# Patient Record
Sex: Male | Born: 1970 | Race: White | Hispanic: No | Marital: Married | Smoking: Never smoker
Health system: Southern US, Community
[De-identification: ages and names within clinical notes are randomized; demographics above are authoritative.]

## PROBLEM LIST (undated history)

## (undated) HISTORY — PX: NO PAST SURGERIES: SHX2092

---

## 2007-09-09 ENCOUNTER — Other Ambulatory Visit: Payer: Self-pay

## 2007-09-09 ENCOUNTER — Emergency Department: Payer: Self-pay | Admitting: Emergency Medicine

## 2007-09-11 ENCOUNTER — Ambulatory Visit: Payer: Self-pay | Admitting: Emergency Medicine

## 2013-08-03 ENCOUNTER — Emergency Department: Payer: Self-pay | Admitting: Emergency Medicine

## 2013-08-03 LAB — CBC WITH DIFFERENTIAL/PLATELET
BASOS ABS: 0 10*3/uL (ref 0.0–0.1)
Basophil %: 0.7 %
Eosinophil #: 0.3 10*3/uL (ref 0.0–0.7)
Eosinophil %: 4 %
HCT: 45.1 % (ref 40.0–52.0)
HGB: 15.6 g/dL (ref 13.0–18.0)
LYMPHS ABS: 0.9 10*3/uL — AB (ref 1.0–3.6)
LYMPHS PCT: 14.5 %
MCH: 30.9 pg (ref 26.0–34.0)
MCHC: 34.7 g/dL (ref 32.0–36.0)
MCV: 89 fL (ref 80–100)
Monocyte #: 0.3 x10 3/mm (ref 0.2–1.0)
Monocyte %: 5.1 %
Neutrophil #: 5 10*3/uL (ref 1.4–6.5)
Neutrophil %: 75.7 %
Platelet: 181 10*3/uL (ref 150–440)
RBC: 5.07 10*6/uL (ref 4.40–5.90)
RDW: 12.8 % (ref 11.5–14.5)
WBC: 6.5 10*3/uL (ref 3.8–10.6)

## 2013-08-03 LAB — COMPREHENSIVE METABOLIC PANEL
ANION GAP: 6 — AB (ref 7–16)
AST: 18 U/L (ref 15–37)
Albumin: 4 g/dL (ref 3.4–5.0)
Alkaline Phosphatase: 79 U/L
BUN: 10 mg/dL (ref 7–18)
Bilirubin,Total: 1.2 mg/dL — ABNORMAL HIGH (ref 0.2–1.0)
Calcium, Total: 8.8 mg/dL (ref 8.5–10.1)
Chloride: 104 mmol/L (ref 98–107)
Co2: 30 mmol/L (ref 21–32)
Creatinine: 1.07 mg/dL (ref 0.60–1.30)
EGFR (African American): >60
EGFR (Non-African Amer.): >60
Glucose: 108 mg/dL — ABNORMAL HIGH (ref 65–99)
Osmolality: 279 (ref 275–301)
Potassium: 3.8 mmol/L (ref 3.5–5.1)
SGPT (ALT): 36 U/L (ref 12–78)
SODIUM: 140 mmol/L (ref 136–145)
Total Protein: 7.3 g/dL (ref 6.4–8.2)

## 2013-08-03 LAB — TROPONIN I: Troponin-I: <0.02 ng/mL

## 2013-08-03 IMAGING — CR DG CHEST 1V PORT
1 series · 2 of 2 positions shown · non-contrast
Comparison: None.

CLINICAL DATA: Chest pain

EXAM:
PORTABLE CHEST - 1 VIEW

[Series 1: ap · 0.17mm/px · 2 of 2 slices shown]
[im 1/2]
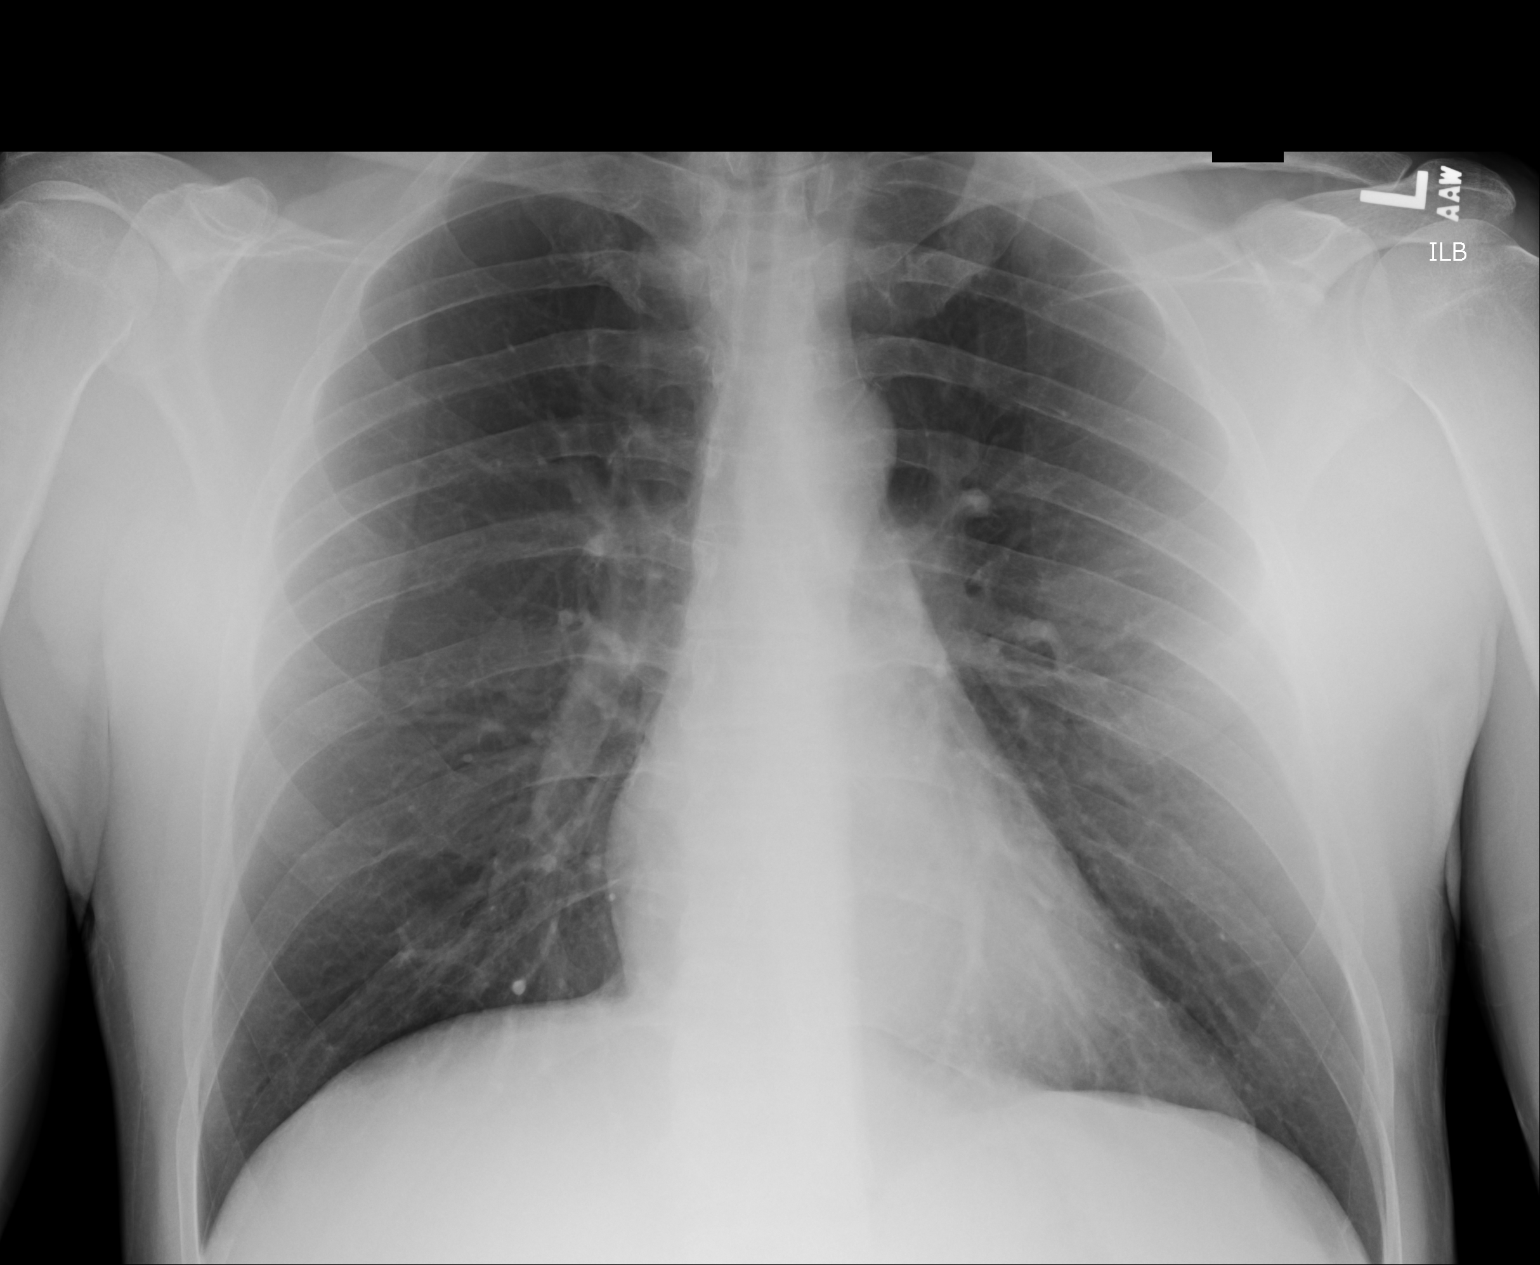
[im 2/2]
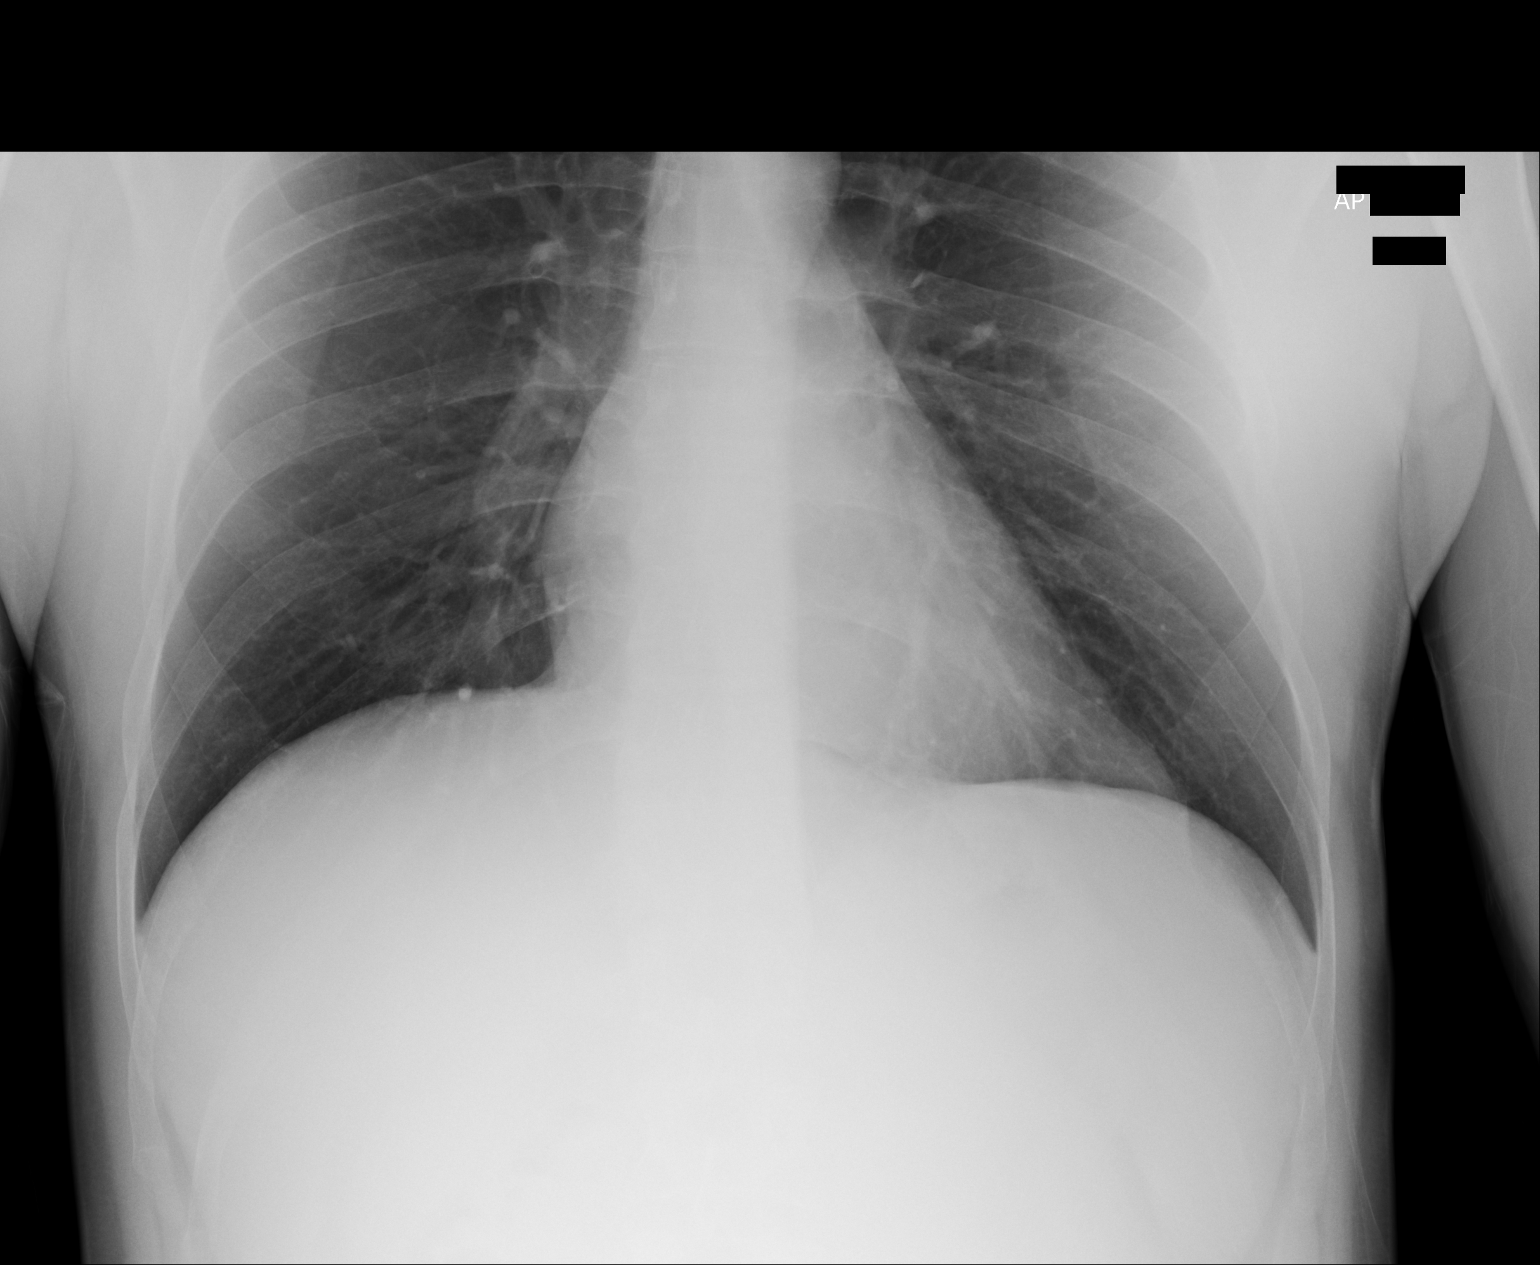

[2 of 2 positions shown; findings below may reference images not displayed]

FINDINGS: The heart size and mediastinal contours are within normal limits.
Both lungs are clear. The visualized skeletal structures are
unremarkable.
IMPRESSION: No active disease.

## 2018-11-19 ENCOUNTER — Other Ambulatory Visit: Payer: Self-pay

## 2018-11-19 ENCOUNTER — Emergency Department: Payer: Managed Care, Other (non HMO)

## 2018-11-19 ENCOUNTER — Encounter: Payer: Self-pay | Admitting: Emergency Medicine

## 2018-11-19 ENCOUNTER — Emergency Department
Admission: EM | Admit: 2018-11-19 | Discharge: 2018-11-20 | Disposition: A | Discharge status: Home / Self Care | Payer: Managed Care, Other (non HMO) | Attending: Emergency Medicine | Admitting: Emergency Medicine

## 2018-11-19 DIAGNOSIS — R0789 Other chest pain: Secondary | ICD-10-CM | POA: Insufficient documentation

## 2018-11-19 DIAGNOSIS — M79602 Pain in left arm: Secondary | ICD-10-CM | POA: Diagnosis not present

## 2018-11-19 DIAGNOSIS — R079 Chest pain, unspecified: Secondary | ICD-10-CM

## 2018-11-19 DIAGNOSIS — R6884 Jaw pain: Secondary | ICD-10-CM | POA: Diagnosis not present

## 2018-11-19 LAB — CBC WITH DIFFERENTIAL/PLATELET
Abs Immature Granulocytes: 0.02 10*3/uL (ref 0.00–0.07)
Basophils Absolute: 0.1 10*3/uL (ref 0.0–0.1)
Basophils Relative: 1 %
Eosinophils Absolute: 0.4 10*3/uL (ref 0.0–0.5)
Eosinophils Relative: 6 %
HCT: 44.5 % (ref 39.0–52.0)
Hemoglobin: 15.8 g/dL (ref 13.0–17.0)
Immature Granulocytes: 0 %
Lymphocytes Relative: 25 %
Lymphs Abs: 1.7 10*3/uL (ref 0.7–4.0)
MCH: 30.7 pg (ref 26.0–34.0)
MCHC: 35.5 g/dL (ref 30.0–36.0)
MCV: 86.4 fL (ref 80.0–100.0)
Monocytes Absolute: 0.5 10*3/uL (ref 0.1–1.0)
Monocytes Relative: 8 %
Neutro Abs: 4.1 10*3/uL (ref 1.7–7.7)
Neutrophils Relative %: 60 %
Platelets: 202 10*3/uL (ref 150–400)
RBC: 5.15 MIL/uL (ref 4.22–5.81)
RDW: 12.1 % (ref 11.5–15.5)
WBC: 6.9 10*3/uL (ref 4.0–10.5)
nRBC: 0 % (ref 0.0–0.2)

## 2018-11-19 LAB — COMPREHENSIVE METABOLIC PANEL
ALT: 39 U/L (ref 0–44)
AST: 25 U/L (ref 15–41)
Albumin: 4.4 g/dL (ref 3.5–5.0)
Alkaline Phosphatase: 65 U/L (ref 38–126)
Anion gap: 4 — ABNORMAL LOW (ref 5–15)
BUN: 10 mg/dL (ref 6–20)
CO2: 30 mmol/L (ref 22–32)
Calcium: 8.7 mg/dL — ABNORMAL LOW (ref 8.9–10.3)
Chloride: 105 mmol/L (ref 98–111)
Creatinine, Ser: 1.02 mg/dL (ref 0.61–1.24)
GFR calc Af Amer: >60 mL/min (ref >60)
GFR calc non Af Amer: >60 mL/min (ref >60)
Glucose, Bld: 106 mg/dL — ABNORMAL HIGH (ref 70–99)
Potassium: 3.8 mmol/L (ref 3.5–5.1)
Sodium: 139 mmol/L (ref 135–145)
Total Bilirubin: 1.4 mg/dL — ABNORMAL HIGH (ref 0.3–1.2)
Total Protein: 7.3 g/dL (ref 6.5–8.1)

## 2018-11-19 LAB — TROPONIN I (HIGH SENSITIVITY): Troponin I (High Sensitivity): 4 ng/L (ref <18)

## 2018-11-19 NOTE — ED Notes (Signed)
Dr Brown and Butch, RN at bedside at this time. 

## 2018-11-19 NOTE — ED Triage Notes (Signed)
Patient ambulatory to triage with steady gait, without difficulty or distress noted, mask in place; pt reports left sided CP radiating into jaw with tingling to left arm since approx 830pm; st hx of same years ago with negative findings; st pain accomp by dizziness; 3-81mg  ASA taken PTA

## 2018-11-20 ENCOUNTER — Encounter: Payer: Self-pay | Admitting: Radiology

## 2018-11-20 ENCOUNTER — Emergency Department: Payer: Managed Care, Other (non HMO)

## 2018-11-20 LAB — TROPONIN I (HIGH SENSITIVITY): Troponin I (High Sensitivity): 4 ng/L (ref <18)

## 2018-11-20 MED ORDER — IOHEXOL 350 MG/ML SOLN
75.0000 mL | Freq: Once | INTRAVENOUS | Status: AC | PRN
  Administered 2018-11-20: 75 mL via INTRAVENOUS

## 2018-11-20 MED ORDER — ALUM & MAG HYDROXIDE-SIMETH 200-200-20 MG/5ML PO SUSP
30.0000 mL | Freq: Once | ORAL | Status: AC
  Administered 2018-11-20: 02:00:00 30 mL via ORAL
  Filled 2018-11-20: qty 30

## 2018-11-20 MED ORDER — LIDOCAINE VISCOUS HCL 2 % MT SOLN
15.0000 mL | Freq: Once | OROMUCOSAL | Status: AC
  Administered 2018-11-20: 15 mL via ORAL
  Filled 2018-11-20: qty 15

## 2018-11-20 NOTE — ED Provider Notes (Signed)
[facility] Emergency Department Provider Note    First MD Initiated Contact with Patient 11/19/18 2356     (approximate)  I have reviewed the triage vital signs and the nursing notes.   HISTORY  Chief Complaint Chest Pain    HPI Walter Baldwin is a 48 y.o. male with no chronic medical conditions presents to the emergency department secondary to left-sided chest pain with radiation to the left arm and jaw which patient states began at 8:30 PM.  Patient states that he had a similar episode 5 years without any etiology identified.  Patient states current pain score is 1 out of 10.  Patient also admits to difficulty swallowing pills feeling as though they get stuck.  Patient states that the pain felt like indigestion.        History reviewed. No pertinent past medical history.  There are no active problems to display for this patient.   History reviewed. No pertinent surgical history.  Prior to Admission medications   Not on File    Allergies Patient has no known allergies.  No family history on file.  Social History Social History   Tobacco Use   Smoking status: Never Smoker   Smokeless tobacco: Never Used  Substance Use Topics   Alcohol use: Not on file   Drug use: Not on file    Review of Systems Constitutional: No fever/chills Eyes: No visual changes. ENT: No sore throat. Cardiovascular: Positive for chest pain. Respiratory: Denies shortness of breath. Gastrointestinal: No abdominal pain.  No nausea, no vomiting.  No diarrhea.  No constipation. Genitourinary: Negative for dysuria. Musculoskeletal: Negative for neck pain.  Negative for back pain. Integumentary: Negative for rash. Neurological: Negative for headaches, focal weakness or numbness.   ____________________________________________   PHYSICAL EXAM:  VITAL SIGNS: ED Triage Vitals  Enc Vitals Group     BP 11/19/18 2208 (!) 165/100     Pulse Rate 11/19/18 2208  69     Resp 11/19/18 2208 18     Temp 11/19/18 2208 98 F (36.7 C)     Temp Source 11/19/18 2208 Oral     SpO2 11/19/18 2208 100 %     Weight 11/19/18 2209 79.4 kg (175 lb)     Height 11/19/18 2209 1.778 m (5\' 10" )     Head Circumference --      Peak Flow --      Pain Score 11/19/18 2208 3     Pain Loc --      Pain Edu? --      Excl. in GC? --     Constitutional: Alert and oriented.  Eyes: Conjunctivae are normal.  Mouth/Throat: Mucous membranes are moist. Neck: No stridor.  No meningeal signs.   Cardiovascular: Normal rate, regular rhythm. Good peripheral circulation. Grossly normal heart sounds. Respiratory: Normal respiratory effort.  No retractions. Gastrointestinal: Soft and nontender. No distention.  Musculoskeletal: No lower extremity tenderness nor edema. No gross deformities of extremities. Neurologic:  Normal speech and language. No gross focal neurologic deficits are appreciated.  Skin:  Skin is warm, dry and intact. Psychiatric: Mood and affect are normal. Speech and behavior are normal.  ____________________________________________   LABS (all labs ordered are listed, but only abnormal results are displayed)  Labs Reviewed  COMPREHENSIVE METABOLIC PANEL - Abnormal; Notable for the following components:      Result Value   Glucose, Bld 106 (*)    Calcium 8.7 (*)    Total Bilirubin 1.4 (*)  Anion gap 4 (*)    All other components within normal limits  CBC WITH DIFFERENTIAL/PLATELET  TROPONIN I (HIGH SENSITIVITY)  TROPONIN I (HIGH SENSITIVITY)   ____________________________________________  EKG  ED ECG REPORT I, [place] Marlaya Turck, the attending physician, personally viewed and interpreted this ECG.   Date: 11/19/2018  EKG Time: 10:13 PM  Rate: 58  Rhythm: Sinus bradycardia  Axis: Normal  Intervals: Normal  ST&T Change: None  ____________________________________________  RADIOLOGY I, [place] Kamia Insalaco, personally viewed and evaluated these  images (plain radiographs) as part of my medical decision making, as well as reviewing the written report by the radiologist.  ED MD interpretation: Chest x-ray revealed normal exam per the radiologist.  CT angiogram of the chest revealed no acute abnormality.  Official radiology report(s): Dg Chest 2 View  Result Date: 11/19/2018 CLINICAL DATA:  Chest pain. EXAM: CHEST - 2 VIEW COMPARISON:  08/03/2013 FINDINGS: The heart size and mediastinal contours are within normal limits. Both lungs are clear. The visualized skeletal structures are unremarkable. IMPRESSION: Normal exam. Electronically Signed   By: Lorriane Shire M.D.   On: 11/19/2018 22:30   Ct Angio Chest Pe W And/or Wo Contrast  Result Date: 11/20/2018 CLINICAL DATA:  Chest pain radiating to jaw EXAM: CT ANGIOGRAPHY CHEST WITH CONTRAST TECHNIQUE: Multidetector CT imaging of the chest was performed using the standard protocol during bolus administration of intravenous contrast. Multiplanar CT image reconstructions and MIPs were obtained to evaluate the vascular anatomy. CONTRAST:  59mL OMNIPAQUE IOHEXOL 350 MG/ML SOLN COMPARISON:  Same-day radiograph FINDINGS: Cardiovascular: Preferential opacification of the pulmonary arteries. There is satisfactory opacification of the pulmonary arteries to the segmental level. No central, lobar or segmental filling defects are identified. No central pulmonary arterial enlargement or elevation of the RV/LV ratio (0.8). The aorta is normal caliber. No dissection flap or other acute luminal abnormality of the aorta is seen. No periaortic stranding or hemorrhage. Shared origin of the brachiocephalic and left common carotid artery. Normal heart size. No pericardial effusion. Mediastinum/Nodes: No enlarged mediastinal or axillary lymph nodes. Thyroid gland, trachea, and esophagus demonstrate no significant findings. Lungs/Pleura: No consolidation, features of edema, pneumothorax, or effusion. Dependent atelectasis  posteriorly. No suspicious pulmonary nodules or masses. Upper Abdomen: No acute abnormalities present in the visualized portions of the upper abdomen. Musculoskeletal: No chest wall abnormality. No acute or significant osseous findings. Review of the MIP images confirms the above findings. IMPRESSION: Satisfactory opacification of the pulmonary arteries without evidence of pulmonary embolism. No visible acute aortic abnormality on this nontailored examination. No acute intrathoracic process. Electronically Signed   By: Lovena Le M.D.   On: 11/20/2018 00:47    Procedures   ____________________________________________   INITIAL IMPRESSION / MDM / ASSESSMENT AND PLAN / ED COURSE  As part of my medical decision making, I reviewed the following data within the electronic MEDICAL RECORD NUMBER   48 year old male presenting with above-stated history and physical exam secondary to chest pain.  Considered possibly CAD MI however EKG revealed no evidence of ischemia or infarction laboratory data including high-sensitivity troponin negative x2.  CT angiogram of chest was performed to evaluate for possible pulmonary emboli aortic aneurysm.  CT revealed no acute abnormality as well.  We will refer patient to cardiology Dr. Ubaldo Glassing  ____________________________________________  FINAL CLINICAL IMPRESSION(S) / ED DIAGNOSES  Final diagnoses:  Nonspecific chest pain     MEDICATIONS GIVEN DURING THIS VISIT:  Medications  alum & mag hydroxide-simeth (MAALOX/MYLANTA) 200-200-20 MG/5ML suspension 30 mL (  has no administration in time range)    And  lidocaine (XYLOCAINE) 2 % viscous mouth solution 15 mL (has no administration in time range)  iohexol (OMNIPAQUE) 350 MG/ML injection 75 mL (75 mLs Intravenous Contrast Given 11/20/18 0028)     ED Discharge Orders    None      *Please note:  ISHAWN MULROY was evaluated in Emergency Department on 11/20/2018 for the symptoms described in the history of present  illness. He was evaluated in the context of the global COVID-19 pandemic, which necessitated consideration that the patient might be at risk for infection with the SARS-CoV-2 virus that causes COVID-19. Institutional protocols and algorithms that pertain to the evaluation of patients at risk for COVID-19 are in a state of rapid change based on information released by regulatory bodies including the CDC and federal and state organizations. These policies and algorithms were followed during the patient's care in the ED.  Some ED evaluations and interventions may be delayed as a result of limited staffing during the pandemic.*  Note:  This document was prepared using Dragon voice recognition software and may include unintentional dictation errors.   Darci Current, MD 11/20/18 [phone]

## 2018-11-20 NOTE — ED Notes (Signed)
Patient transported to CT at this time. 

## 2018-12-03 DIAGNOSIS — J982 Interstitial emphysema: Secondary | ICD-10-CM

## 2018-12-03 HISTORY — DX: Interstitial emphysema: J98.2

## 2018-12-04 ENCOUNTER — Ambulatory Visit (INDEPENDENT_AMBULATORY_CARE_PROVIDER_SITE_OTHER): Payer: Managed Care, Other (non HMO) | Admitting: Physician Assistant

## 2018-12-04 ENCOUNTER — Encounter: Payer: Self-pay | Admitting: Physician Assistant

## 2018-12-04 ENCOUNTER — Other Ambulatory Visit: Payer: Self-pay | Admitting: Physician Assistant

## 2018-12-04 ENCOUNTER — Other Ambulatory Visit: Payer: Self-pay

## 2018-12-04 VITALS — BP 136/90 | HR 65 | Temp 97.1°F | Ht 69.0 in | Wt 167.0 lb

## 2018-12-04 DIAGNOSIS — Z23 Encounter for immunization: Secondary | ICD-10-CM

## 2018-12-04 DIAGNOSIS — Z0000 Encounter for general adult medical examination without abnormal findings: Secondary | ICD-10-CM | POA: Diagnosis not present

## 2018-12-04 DIAGNOSIS — Z1211 Encounter for screening for malignant neoplasm of colon: Secondary | ICD-10-CM

## 2018-12-04 DIAGNOSIS — Z114 Encounter for screening for human immunodeficiency virus [HIV]: Secondary | ICD-10-CM

## 2018-12-04 NOTE — Patient Instructions (Signed)

## 2018-12-04 NOTE — Progress Notes (Signed)
Patient: Walter Baldwin Male    DOB: 05/15/1970   48 y.o.   MRN: [id number] Visit Date: 12/04/2018  Today's Provider: Trey Sailors, PA-C   Chief Complaint  Patient presents with  . Establish Care   Subjective:     HPI  Living with wife of 30 years, two children a son aged 66 and daughter aged 35. 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 Alcohol: none. Drugs: never.   Mom died of heart at age 50. Dad died of lung cancer at age 101. No family history of colon or prostate cancer.   Seen in [facility] for chest pain on 11/19/2018 with negative workup. Reports he underwent stress test in 2015 with Dr. Welton Flakes which was negative.   Did eat today: fruit loops at 3:30 AM and peanut butter crackers   Upcoming appointment with [facility] for difficulty swallowing.   BP slightly elevated today but patient reports 120s/80s at home.   BP Readings from Last 3 Encounters:  12/04/18 136/90  11/20/18 132/89    No Known Allergies  No current outpatient medications on file.  Review of Systems  Constitutional: Negative.   HENT: Positive for trouble swallowing. Negative for congestion, dental problem, drooling, ear discharge, ear pain, facial swelling, hearing loss, mouth sores, nosebleeds, postnasal drip, rhinorrhea, sinus pressure, sinus pain, sneezing, sore throat, tinnitus and voice change.   Eyes: Negative.   Respiratory: Negative.   Cardiovascular: Negative.   Gastrointestinal: Negative.   Endocrine: Negative.   Genitourinary: Negative.   Musculoskeletal: Negative.   Skin: Negative.   Allergic/Immunologic: Negative.   Neurological: Negative.   Hematological: Negative.   Psychiatric/Behavioral: Negative.     Social History   Tobacco Use  . Smoking status: Never Smoker  . Smokeless tobacco: Never Used  Substance Use Topics  . Alcohol use: Never    Frequency: Never      Objective:   BP 136/90 (BP Location: Left Arm, Patient Position: Sitting, Cuff Size:  Normal)   Pulse 65   Temp (!) 97.1 F (36.2 C) (Temporal)   Ht 5\' 9"  (1.753 m)   Wt 167 lb (75.8 kg)   BMI 24.66 kg/m  Vitals:   12/04/18 1003  BP: 136/90  Pulse: 65  Temp: (!) 97.1 F (36.2 C)  TempSrc: Temporal  Weight: 167 lb (75.8 kg)  Height: 5\' 9"  (1.753 m)  Body mass index is 24.66 kg/m.   Physical Exam Constitutional:      Appearance: Normal appearance. He is normal weight.  Cardiovascular:     Rate and Rhythm: Normal rate and regular rhythm.     Heart sounds: Normal heart sounds.  Pulmonary:     Effort: Pulmonary effort is normal.     Breath sounds: Normal breath sounds.  Abdominal:     General: Abdomen is flat.     Palpations: Abdomen is soft.  Skin:    General: Skin is warm and dry.  Neurological:     Mental Status: He is alert and oriented to person, place, and time. Mental status is at baseline.  Psychiatric:        Mood and Affect: Mood normal.        Behavior: Behavior normal.      No results found for any visits on 12/04/18.     Assessment & Plan    1. Annual physical exam  - Comprehensive Metabolic Panel (CMET) - CBC with Differential - TSH - Lipid Profile - HgB  A1c - Direct LDL  2. Need for influenza vaccination - Flu Vaccine QUAD 6+ mos PF IM (Fluarix Quad PF)  3. Need for Tdap vaccination  - Tdap vaccine greater than or equal to 7yo IM  4. Colon cancer screening  Patient does not want to do colonoscopy. Will order cologuard as below. Urged him to think about getting colonoscopy done if he will need endoscopy.   - Cologuard  5. Encounter for screening for HIV  - HIV antibody (with reflex)  The entirety of the information documented in the History of Present Illness, Review of Systems and Physical Exam were personally obtained by me. Portions of this information were initially documented by Ashley Royalty, CMA and reviewed by me for thoroughness and accuracy.   F/u 1 year CPE     Trinna Post, PA-C  [facility]

## 2018-12-05 LAB — CBC WITH DIFFERENTIAL/PLATELET
Basophils Absolute: 0.1 10*3/uL (ref 0.0–0.2)
Basos: 1 %
EOS (ABSOLUTE): 0.3 10*3/uL (ref 0.0–0.4)
Eos: 5 %
Hematocrit: 44.9 % (ref 37.5–51.0)
Hemoglobin: 16 g/dL (ref 13.0–17.7)
Immature Grans (Abs): 0 10*3/uL (ref 0.0–0.1)
Immature Granulocytes: 0 %
Lymphocytes Absolute: 1.5 10*3/uL (ref 0.7–3.1)
Lymphs: 22 %
MCH: 31.5 pg (ref 26.6–33.0)
MCHC: 35.6 g/dL (ref 31.5–35.7)
MCV: 88 fL (ref 79–97)
Monocytes Absolute: 0.5 10*3/uL (ref 0.1–0.9)
Monocytes: 7 %
Neutrophils Absolute: 4.2 10*3/uL (ref 1.4–7.0)
Neutrophils: 65 %
Platelets: 208 10*3/uL (ref 150–450)
RBC: 5.08 x10E6/uL (ref 4.14–5.80)
RDW: 12.5 % (ref 11.6–15.4)
WBC: 6.6 10*3/uL (ref 3.4–10.8)

## 2018-12-05 LAB — TSH: TSH: 2.36 u[IU]/mL (ref 0.450–4.500)

## 2018-12-05 LAB — COMPREHENSIVE METABOLIC PANEL
ALT: 35 IU/L (ref 0–44)
AST: 23 IU/L (ref 0–40)
Albumin/Globulin Ratio: 1.8 (ref 1.2–2.2)
Albumin: 4.6 g/dL (ref 4.0–5.0)
Alkaline Phosphatase: 74 IU/L (ref 39–117)
BUN/Creatinine Ratio: 8 — ABNORMAL LOW (ref 9–20)
BUN: 9 mg/dL (ref 6–24)
Bilirubin Total: 1 mg/dL (ref 0.0–1.2)
CO2: 26 mmol/L (ref 20–29)
Calcium: 9.4 mg/dL (ref 8.7–10.2)
Chloride: 102 mmol/L (ref 96–106)
Creatinine, Ser: 1.15 mg/dL (ref 0.76–1.27)
GFR calc Af Amer: 86 mL/min/{1.73_m2} (ref >59)
GFR calc non Af Amer: 74 mL/min/{1.73_m2} (ref >59)
Globulin, Total: 2.5 g/dL (ref 1.5–4.5)
Glucose: 87 mg/dL (ref 65–99)
Potassium: 4.3 mmol/L (ref 3.5–5.2)
Sodium: 141 mmol/L (ref 134–144)
Total Protein: 7.1 g/dL (ref 6.0–8.5)

## 2018-12-05 LAB — LIPID PANEL
Chol/HDL Ratio: 5 ratio (ref 0.0–5.0)
Cholesterol, Total: 156 mg/dL (ref 100–199)
HDL: 31 mg/dL — ABNORMAL LOW (ref >39)
LDL Chol Calc (NIH): 83 mg/dL (ref 0–99)
Triglycerides: 251 mg/dL — ABNORMAL HIGH (ref 0–149)
VLDL Cholesterol Cal: 42 mg/dL — ABNORMAL HIGH (ref 5–40)

## 2018-12-05 LAB — HIV ANTIBODY (ROUTINE TESTING W REFLEX): HIV Screen 4th Generation wRfx: NONREACTIVE

## 2018-12-05 LAB — LDL CHOLESTEROL, DIRECT: LDL Direct: 88 mg/dL (ref 0–99)

## 2018-12-05 LAB — HEMOGLOBIN A1C
Est. average glucose Bld gHb Est-mCnc: 91 mg/dL
Hgb A1c MFr Bld: 4.8 % (ref 4.8–5.6)

## 2018-12-08 ENCOUNTER — Telehealth: Payer: Self-pay

## 2018-12-08 NOTE — Telephone Encounter (Signed)
Patient advised as below. Patient verbalizes understanding and is in agreement with treatment plan.  

## 2018-12-08 NOTE — Telephone Encounter (Signed)
-----   Message from Trinna Post, [place] sent at 12/08/2018 12:33 PM EDT ----- Triglycerides are slightly high. These can be decreased with diet and exercise. May also take 1000 mg fish oil supplement daily if he wants. Remainder of labwork looks good. Follow up in one year and keep an eye on his blood pressure.

## 2018-12-17 DIAGNOSIS — M6740 Ganglion, unspecified site: Secondary | ICD-10-CM | POA: Insufficient documentation

## 2018-12-17 DIAGNOSIS — J309 Allergic rhinitis, unspecified: Secondary | ICD-10-CM | POA: Insufficient documentation

## 2018-12-21 ENCOUNTER — Other Ambulatory Visit: Payer: Self-pay

## 2018-12-21 ENCOUNTER — Ambulatory Visit: Payer: Managed Care, Other (non HMO) | Admitting: Gastroenterology

## 2018-12-21 ENCOUNTER — Encounter: Payer: Self-pay | Admitting: Gastroenterology

## 2018-12-21 VITALS — BP 171/83 | HR 81 | Temp 98.2°F | Wt 169.4 lb

## 2018-12-21 DIAGNOSIS — R1310 Dysphagia, unspecified: Secondary | ICD-10-CM | POA: Diagnosis not present

## 2018-12-21 NOTE — Progress Notes (Signed)
Walter Baldwin [address]  [address]  [place], [place] [address]  Main: [phone]  Fax: [phone]   Gastroenterology Consultation  Referring Provider:     Dr. Jodi Marble Primary Care Physician:  Trey Sailors, PA-C Reason for Consultation:    Dysphagia        HPI:    Chief Complaint  Patient presents with  . New Patient (Initial Visit)    Patient states he is having no symptoms     Walter Baldwin is a 48 y.o. y/o male referred for consultation & management  by Dr. Jodi Marble, Lavella Hammock, PA-C.  Patient reports 2-year history of intermittent dysphagia to pills and solids such as meats.  States he has to chew really well sometimes food gets stuck at the xiphoid notch.  Most the time he drinks water and it goes down, sometimes he has to vomit it back up.  No hematemesis.  Occurs about once a month.  No weight loss, no abdominal pain, no blood in stool, no altered bowel habits.  No prior EGD or colonoscopy.  No family history of colon cancer.  History reviewed. No pertinent past medical history.  Past Surgical History:  Procedure Laterality Date  . NO PAST SURGERIES      Prior to Admission medications   Not on File    Family History  Problem Relation Age of Onset  . Heart attack Mother   . Lung cancer Father   . Diabetes Brother      Social History   Tobacco Use  . Smoking status: Never Smoker  . Smokeless tobacco: Never Used  Substance Use Topics  . Alcohol use: Never    Frequency: Never  . Drug use: Never    Allergies as of 12/21/2018  . (No Known Allergies)    Review of Systems:    All systems reviewed and negative except where noted in HPI.   Physical Exam:  BP (!) 171/83 (BP Location: Left Arm, Patient Position: Sitting, Cuff Size: Normal)   Pulse 81   Temp 98.2 F (36.8 C) (Oral)   Wt 169 lb 6 oz (76.8 kg)   BMI 25.01 kg/m  No LMP for male patient. Psych:  Alert and cooperative. Normal mood and affect. General:   Alert,   Well-developed, well-nourished, pleasant and cooperative in NAD Head:  Normocephalic and atraumatic. Eyes:  Sclera clear, no icterus.   Conjunctiva pink. Ears:  Normal auditory acuity. Nose:  No deformity, discharge, or lesions. Mouth:  No deformity or lesions,oropharynx pink & moist. Neck:  Supple; no masses or thyromegaly. Abdomen:  Normal bowel sounds.  No bruits.  Soft, non-tender and non-distended without masses, hepatosplenomegaly or hernias noted.  No guarding or rebound tenderness.    Msk:  Symmetrical without gross deformities. Good, equal movement & strength bilaterally. Pulses:  Normal pulses noted. Extremities:  No clubbing or edema.  No cyanosis. Neurologic:  Alert and oriented x3;  grossly normal neurologically. Skin:  Intact without significant lesions or rashes. No jaundice. Lymph Nodes:  No significant cervical adenopathy. Psych:  Alert and cooperative. Normal mood and affect.   Labs: CBC    Component Value Date/Time   WBC 6.6 12/04/2018 1058   WBC 6.9 11/19/2018 2213   RBC 5.08 12/04/2018 1058   RBC 5.15 11/19/2018 2213   HGB 16.0 12/04/2018 1058   HCT 44.9 12/04/2018 1058   PLT 208 12/04/2018 1058   MCV 88 12/04/2018 1058   MCV 89 08/03/2013 0647  MCH 31.5 12/04/2018 1058   MCH 30.7 11/19/2018 2213   MCHC 35.6 12/04/2018 1058   MCHC 35.5 11/19/2018 2213   RDW 12.5 12/04/2018 1058   RDW 12.8 08/03/2013 0647   LYMPHSABS 1.5 12/04/2018 1058   LYMPHSABS 0.9 (L) 08/03/2013 0647   MONOABS 0.5 11/19/2018 2213   MONOABS 0.3 08/03/2013 0647   EOSABS 0.3 12/04/2018 1058   EOSABS 0.3 08/03/2013 0647   BASOSABS 0.1 12/04/2018 1058   BASOSABS 0.0 08/03/2013 0647   CMP     Component Value Date/Time   NA 141 12/04/2018 1058   NA 140 08/03/2013 0647   K 4.3 12/04/2018 1058   K 3.8 08/03/2013 0647   CL 102 12/04/2018 1058   CL 104 08/03/2013 0647   CO2 26 12/04/2018 1058   CO2 30 08/03/2013 0647   GLUCOSE 87 12/04/2018 1058   GLUCOSE 106 (H) 11/19/2018 2213    GLUCOSE 108 (H) 08/03/2013 0647   BUN 9 12/04/2018 1058   BUN 10 08/03/2013 0647   CREATININE 1.15 12/04/2018 1058   CREATININE 1.07 08/03/2013 0647   CALCIUM 9.4 12/04/2018 1058   CALCIUM 8.8 08/03/2013 0647   PROT 7.1 12/04/2018 1058   PROT 7.3 08/03/2013 0647   ALBUMIN 4.6 12/04/2018 1058   ALBUMIN 4.0 08/03/2013 0647   AST 23 12/04/2018 1058   AST 18 08/03/2013 0647   ALT 35 12/04/2018 1058   ALT 36 08/03/2013 0647   ALKPHOS 74 12/04/2018 1058   ALKPHOS 79 08/03/2013 0647   BILITOT 1.0 12/04/2018 1058   BILITOT 1.2 (H) 08/03/2013 0647   GFRNONAA 74 12/04/2018 1058   GFRNONAA >60 08/03/2013 0647   GFRAA 86 12/04/2018 1058   GFRAA >60 08/03/2013 0647    Imaging Studies: No results found.  Assessment and Plan:   Walter Baldwin is a 48 y.o. y/o male has been referred for dysphagia  EGD indicated to rule out strictures, EOE, or any other lesions  Colonoscopy for screening discussed as well but patient refuses and states he is getting Cologuard done by his primary care provider.  Limitations of Cologuard and benefits and risks of colonoscopy discussed in detail but the patient continues to refuse.  I have discussed alternative options, risks & benefits,  which include, but are not limited to, bleeding, infection, perforation,respiratory complication & drug reaction.  The patient agrees with this plan & written consent will be obtained.        Dr Vonda Antigua  Speech recognition software was used to dictate the above note.

## 2018-12-24 ENCOUNTER — Other Ambulatory Visit: Payer: Self-pay

## 2018-12-24 ENCOUNTER — Other Ambulatory Visit
Admission: RE | Admit: 2018-12-24 | Discharge: 2018-12-24 | Disposition: A | Discharge status: Home / Self Care | Payer: Managed Care, Other (non HMO) | Source: Ambulatory Visit | Attending: Gastroenterology | Admitting: Gastroenterology

## 2018-12-24 DIAGNOSIS — Z01812 Encounter for preprocedural laboratory examination: Secondary | ICD-10-CM | POA: Diagnosis not present

## 2018-12-24 DIAGNOSIS — Z20828 Contact with and (suspected) exposure to other viral communicable diseases: Secondary | ICD-10-CM | POA: Diagnosis not present

## 2018-12-24 LAB — SARS CORONAVIRUS 2 (TAT 6-24 HRS): SARS Coronavirus 2: NEGATIVE

## 2018-12-28 ENCOUNTER — Ambulatory Visit
Admission: RE | Admit: 2018-12-28 | Discharge: 2018-12-28 | Disposition: A | Discharge status: Home / Self Care | Payer: Managed Care, Other (non HMO) | Source: Ambulatory Visit | Attending: Gastroenterology | Admitting: Gastroenterology

## 2018-12-28 ENCOUNTER — Emergency Department
Admission: EM | Admit: 2018-12-28 | Discharge: 2018-12-29 | Disposition: A | Discharge status: Home / Self Care | Payer: Managed Care, Other (non HMO) | Source: Home / Self Care | Attending: Emergency Medicine | Admitting: Emergency Medicine

## 2018-12-28 ENCOUNTER — Ambulatory Visit: Payer: Managed Care, Other (non HMO) | Admitting: Anesthesiology

## 2018-12-28 ENCOUNTER — Ambulatory Visit
Admission: RE | Admit: 2018-12-28 | Discharge: 2018-12-28 | Disposition: A | Discharge status: Home / Self Care | Payer: Managed Care, Other (non HMO) | Attending: Gastroenterology | Admitting: Gastroenterology

## 2018-12-28 ENCOUNTER — Encounter
Admission: RE | Disposition: A | Discharge status: Home / Self Care | Payer: Self-pay | Source: Home / Self Care | Attending: Gastroenterology

## 2018-12-28 ENCOUNTER — Telehealth: Payer: Self-pay | Admitting: Gastroenterology

## 2018-12-28 ENCOUNTER — Encounter: Payer: Self-pay | Admitting: Emergency Medicine

## 2018-12-28 ENCOUNTER — Inpatient Hospital Stay
Admission: EM | Admit: 2018-12-28 | Payer: Managed Care, Other (non HMO) | Source: Other Acute Inpatient Hospital | Admitting: Internal Medicine

## 2018-12-28 ENCOUNTER — Other Ambulatory Visit: Payer: Self-pay

## 2018-12-28 DIAGNOSIS — J982 Interstitial emphysema: Secondary | ICD-10-CM | POA: Insufficient documentation

## 2018-12-28 DIAGNOSIS — Q402 Other specified congenital malformations of stomach: Secondary | ICD-10-CM | POA: Diagnosis not present

## 2018-12-28 DIAGNOSIS — K228 Other specified diseases of esophagus: Secondary | ICD-10-CM

## 2018-12-28 DIAGNOSIS — K2289 Other specified disease of esophagus: Secondary | ICD-10-CM

## 2018-12-28 DIAGNOSIS — K2980 Duodenitis without bleeding: Secondary | ICD-10-CM | POA: Insufficient documentation

## 2018-12-28 DIAGNOSIS — R0789 Other chest pain: Secondary | ICD-10-CM

## 2018-12-28 DIAGNOSIS — K3189 Other diseases of stomach and duodenum: Secondary | ICD-10-CM | POA: Diagnosis not present

## 2018-12-28 DIAGNOSIS — R079 Chest pain, unspecified: Secondary | ICD-10-CM

## 2018-12-28 DIAGNOSIS — R1310 Dysphagia, unspecified: Secondary | ICD-10-CM | POA: Diagnosis not present

## 2018-12-28 DIAGNOSIS — K222 Esophageal obstruction: Secondary | ICD-10-CM | POA: Diagnosis not present

## 2018-12-28 DIAGNOSIS — Z20828 Contact with and (suspected) exposure to other viral communicable diseases: Secondary | ICD-10-CM | POA: Insufficient documentation

## 2018-12-28 HISTORY — PX: ESOPHAGOGASTRODUODENOSCOPY (EGD) WITH PROPOFOL: SHX5813

## 2018-12-28 LAB — BASIC METABOLIC PANEL
Anion gap: 10 (ref 5–15)
BUN: 10 mg/dL (ref 6–20)
CO2: 27 mmol/L (ref 22–32)
Calcium: 9.2 mg/dL (ref 8.9–10.3)
Chloride: 102 mmol/L (ref 98–111)
Creatinine, Ser: 1.07 mg/dL (ref 0.61–1.24)
GFR calc Af Amer: >60 mL/min (ref >60)
GFR calc non Af Amer: >60 mL/min (ref >60)
Glucose, Bld: 117 mg/dL — ABNORMAL HIGH (ref 70–99)
Potassium: 3.6 mmol/L (ref 3.5–5.1)
Sodium: 139 mmol/L (ref 135–145)

## 2018-12-28 LAB — CBC WITH DIFFERENTIAL/PLATELET
Abs Immature Granulocytes: 0.04 10*3/uL (ref 0.00–0.07)
Basophils Absolute: 0.1 10*3/uL (ref 0.0–0.1)
Basophils Relative: 0 %
Eosinophils Absolute: 0.2 10*3/uL (ref 0.0–0.5)
Eosinophils Relative: 1 %
HCT: 45.5 % (ref 39.0–52.0)
Hemoglobin: 15.6 g/dL (ref 13.0–17.0)
Immature Granulocytes: 0 %
Lymphocytes Relative: 14 %
Lymphs Abs: 1.9 10*3/uL (ref 0.7–4.0)
MCH: 30.6 pg (ref 26.0–34.0)
MCHC: 34.3 g/dL (ref 30.0–36.0)
MCV: 89.4 fL (ref 80.0–100.0)
Monocytes Absolute: 0.8 10*3/uL (ref 0.1–1.0)
Monocytes Relative: 6 %
Neutro Abs: 10.8 10*3/uL — ABNORMAL HIGH (ref 1.7–7.7)
Neutrophils Relative %: 79 %
Platelets: 203 10*3/uL (ref 150–400)
RBC: 5.09 MIL/uL (ref 4.22–5.81)
RDW: 12.6 % (ref 11.5–15.5)
WBC: 13.8 10*3/uL — ABNORMAL HIGH (ref 4.0–10.5)
nRBC: 0 % (ref 0.0–0.2)

## 2018-12-28 LAB — SARS CORONAVIRUS 2 BY RT PCR (HOSPITAL ORDER, PERFORMED IN ~~LOC~~ HOSPITAL LAB): SARS Coronavirus 2: NEGATIVE

## 2018-12-28 SURGERY — ESOPHAGOGASTRODUODENOSCOPY (EGD) WITH PROPOFOL
Anesthesia: General

## 2018-12-28 MED ORDER — SODIUM CHLORIDE 0.9 % IV SOLN
Freq: Once | INTRAVENOUS | Status: AC
  Administered 2018-12-28: 22:00:00 via INTRAVENOUS

## 2018-12-28 MED ORDER — LIDOCAINE HCL (CARDIAC) PF 100 MG/5ML IV SOSY
PREFILLED_SYRINGE | INTRAVENOUS | Status: DC | PRN
  Administered 2018-12-28: 50 mg via INTRAVENOUS

## 2018-12-28 MED ORDER — PROPOFOL 10 MG/ML IV BOLUS
INTRAVENOUS | Status: DC | PRN
  Administered 2018-12-28: 60 mg via INTRAVENOUS
  Administered 2018-12-28: 40 mg via INTRAVENOUS

## 2018-12-28 MED ORDER — FENTANYL CITRATE (PF) 100 MCG/2ML IJ SOLN: 50.0000 ug | INTRAMUSCULAR | Status: DC | PRN

## 2018-12-28 MED ORDER — PROPOFOL 500 MG/50ML IV EMUL
INTRAVENOUS | Status: DC | PRN
  Administered 2018-12-28: 150 ug/kg/min via INTRAVENOUS

## 2018-12-28 MED ORDER — SODIUM CHLORIDE 0.9 % IV SOLN
INTRAVENOUS | Status: DC
  Administered 2018-12-28: 1000 mL via INTRAVENOUS

## 2018-12-28 MED ORDER — TRAMADOL HCL 50 MG PO TABS: 50.0000 mg | ORAL_TABLET | Freq: Four times a day (QID) | ORAL | 0 refills | Status: DC | PRN

## 2018-12-28 MED ORDER — PIPERACILLIN-TAZOBACTAM 3.375 G IVPB 30 MIN
3.3750 g | Freq: Once | INTRAVENOUS | Status: AC
  Administered 2018-12-28: 3.375 g via INTRAVENOUS
  Filled 2018-12-28: qty 50

## 2018-12-28 MED ORDER — IOHEXOL 300 MG/ML  SOLN
75.0000 mL | Freq: Once | INTRAMUSCULAR | Status: AC | PRN
  Administered 2018-12-28: 75 mL via INTRAVENOUS

## 2018-12-28 MED ORDER — FENTANYL CITRATE (PF) 100 MCG/2ML IJ SOLN
50.0000 ug | Freq: Once | INTRAMUSCULAR | Status: AC
  Administered 2018-12-28: 50 ug via INTRAVENOUS
  Filled 2018-12-28: qty 2

## 2018-12-28 MED ORDER — EPOETIN ALFA-EPBX 10000 UNIT/ML IJ SOLN
INTRAMUSCULAR | Status: AC
  Filled 2018-12-28: qty 3

## 2018-12-28 MED ORDER — ONDANSETRON HCL 4 MG/2ML IJ SOLN
4.0000 mg | Freq: Once | INTRAMUSCULAR | Status: AC
  Administered 2018-12-28: 19:00:00 4 mg via INTRAVENOUS
  Filled 2018-12-28: qty 2

## 2018-12-28 MED ORDER — PROPOFOL 500 MG/50ML IV EMUL
INTRAVENOUS | Status: AC
  Filled 2018-12-28: qty 50

## 2018-12-28 NOTE — Anesthesia Postprocedure Evaluation (Signed)
Anesthesia Post Note  Patient: Walter Baldwin  Procedure(s) Performed: ESOPHAGOGASTRODUODENOSCOPY (EGD) WITH PROPOFOL (N/A )  Patient location during evaluation: Endoscopy Anesthesia Type: General Level of consciousness: awake and alert and oriented Pain management: pain level controlled Vital Signs Assessment: post-procedure vital signs reviewed and stable Respiratory status: spontaneous breathing, nonlabored ventilation and respiratory function stable Cardiovascular status: blood pressure returned to baseline and stable Postop Assessment: no signs of nausea or vomiting Anesthetic complications: no     Last Vitals:  Vitals:   12/28/18 0852 12/28/18 0902  BP: 120/78 123/82  Pulse:    Resp: 17 12  Temp:    SpO2: 99%     Last Pain:  Vitals:   12/28/18 0912  TempSrc:   PainSc: 0-No pain                 Mariel Lukins

## 2018-12-28 NOTE — ED Triage Notes (Signed)
Pt here from CT. Pt was sent for outpatient CT due to chest pain and the CT showed a pneumomediastinum. MD advised CT staff to send patient to the ED so he could be transferred to [facility].

## 2018-12-28 NOTE — Telephone Encounter (Signed)
Please advised  

## 2018-12-28 NOTE — Telephone Encounter (Signed)
I spoke to Dr. Purcell Nails from radiology and he confirms that there is no clear evidence of perforation.  The pneumomediastinum may be due to a tear.  I further discussed next studies that would help delineate any perforation and he recommended a CT with dilute oral water contrast.  We will schedule this for today stat.  I spoke to the patient and he did not appear in any distress over the phone.  In fact he was on his drive home.  He understands instructions for the CT and my nurse will call them back to to instruct him where to go.  He has picked up liquid Tylenol over-the-counter to help with the pain.  I have asked him to not start any medications until the CT is done.  We will also give him tramadol for breakthrough pain if the liquid Tylenol does not help.  We have only given him 4 tablets of this, and again I have asked him to not take anything by mouth until I call him back with the CT results.  The tramadol is only to be used for severe pain if liquid Tylenol is not helping.  However, when my nurse called him about the instructions were taking the tramadol, he states he will not take it and prefers to take liquid medications instead, therefore the prescription was shredded and not given to the patient.  If his pain gets worse he was asked to go to the ER

## 2018-12-28 NOTE — Telephone Encounter (Signed)
Pt wife left vm pt had procedure this morning and is having chest pain please call pt

## 2018-12-28 NOTE — Telephone Encounter (Signed)
Pt wife left vm they just went and has an X ray and would like to know the next step pt is in a lot of pain please call

## 2018-12-28 NOTE — Addendum Note (Signed)
Addended by: Ulyess Blossom L on: 12/28/2018 04:27 PM   Modules accepted: Orders

## 2018-12-28 NOTE — H&P (Signed)
Vonda Antigua, MD [address], [place], [place], [place], [address] [address], [place], [place], [place], [address] Phone: [phone]  Fax: [phone]  Primary Care Physician:  Trinna Post, PA-C   Pre-Procedure History & Physical: HPI:  Walter Baldwin is a 48 y.o. male is here for an EGD.   No past medical history on file.  Past Surgical History:  Procedure Laterality Date  . NO PAST SURGERIES      Prior to Admission medications   Not on File    Allergies as of 12/21/2018  . (No Known Allergies)    Family History  Problem Relation Age of Onset  . Heart attack Mother   . Lung cancer Father   . Diabetes Brother     Social History   Socioeconomic History  . Marital status: Married    Spouse name: Not on file  . Number of children: Not on file  . Years of education: Not on file  . Highest education level: Not on file  Occupational History  . Not on file  Social Needs  . Financial resource strain: Not on file  . Food insecurity    Worry: Not on file    Inability: Not on file  . Transportation needs    Medical: Not on file    Non-medical: Not on file  Tobacco Use  . Smoking status: Never Smoker  . Smokeless tobacco: Never Used  Substance and Sexual Activity  . Alcohol use: Never    Frequency: Never  . Drug use: Never  . Sexual activity: Not on file  Lifestyle  . Physical activity    Days per week: Not on file    Minutes per session: Not on file  . Stress: Not on file  Relationships  . Social Herbalist on phone: Not on file    Gets together: Not on file    Attends religious service: Not on file    Active member of club or organization: Not on file    Attends meetings of clubs or organizations: Not on file    Relationship status: Not on file  . Intimate partner violence    Fear of current or ex partner: Not on file    Emotionally abused: Not on file    Physically abused: Not on file    Forced sexual activity: Not  on file  Other Topics Concern  . Not on file  Social History Narrative  . Not on file    Review of Systems: See HPI, otherwise negative ROS  Physical Exam: BP 133/88   Pulse (!) 54   Temp (!) 96.2 F (35.7 C) (Tympanic)   Resp 16   Ht 5\' 10"  (1.778 m)   Wt 75.8 kg   SpO2 100%   BMI 23.96 kg/m  General:   Alert,  pleasant and cooperative in NAD Head:  Normocephalic and atraumatic. Neck:  Supple; no masses or thyromegaly. Lungs:  Clear throughout to auscultation, normal respiratory effort.    Heart:  +S1, +S2, Regular rate and rhythm, No edema. Abdomen:  Soft, nontender and nondistended. Normal bowel sounds, without guarding, and without rebound.   Neurologic:  Alert and  oriented x4;  grossly normal neurologically.  Impression/Plan: Walter Baldwin is here for an EGD for dysphagia  Risks, benefits, limitations, and alternatives regarding the procedure have been reviewed with the patient.  Questions have been answered.  All parties agreeable.   Virgel Manifold, MD  12/28/2018, 8:11  AM    

## 2018-12-28 NOTE — ED Provider Notes (Addendum)
[facility] Emergency Department Provider Note  ____________________________________________   First MD Initiated Contact with Patient 12/28/18 1851     (approximate)  I have reviewed the triage vital signs and the nursing notes.   HISTORY  Chief Complaint Chest Pain    HPI Walter Baldwin is a 48 y.o. male who underwent endoscopy who comes in for abnormal CT scan.  Patient has CT concerning for pneumomediastinum. GI recommend should the patient come to a tertiary care center in order to have clips or stent placed endoscopically at the site of the thoracic surgery.  Patient says he had in his endoscopy at 7 AM.  When he was discharged he had some minimal pain but then around 10 AM he developed worsening chest pain that was severe, midsternal, nonradiating, nothing made it better or worse.  Denies relating shortness of breath with it.  X-rays were concerning for pneumo retroperitoneum so patient had CT scan done that showed pneumomediastinum  10/22 negative covid test.           History reviewed. No pertinent past medical history.  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  Family History  Problem Relation Age of Onset   Heart attack Mother    Lung cancer Father    Diabetes Brother     Social History Social History   Tobacco Use   Smoking status: Never Smoker   Smokeless tobacco: Never Used  Substance Use Topics   Alcohol use: Never    Frequency: Never   Drug use: Never      Review of Systems Constitutional: No fever/chills Eyes: No visual  changes. ENT: No sore throat. Cardiovascular: Positive chest pain Respiratory: Denies shortness of breath. Gastrointestinal: No abdominal pain.  No nausea, no vomiting.  No diarrhea.  No constipation. Genitourinary: Negative for dysuria. Musculoskeletal: Negative for back pain. Skin: Negative for rash. Neurological: Negative for headaches, focal weakness or numbness. All other ROS negative ____________________________________________   PHYSICAL EXAM:  VITAL SIGNS: ED Triage Vitals [12/28/18 1839]  Enc Vitals Group     BP      Pulse      Resp      Temp      Temp src      SpO2      Weight 165 lb (74.8 kg)     Height 5\' 9"  (1.753 m)     Head Circumference      Peak Flow      Pain Score 5     Pain Loc      Pain Edu?      Excl. in GC?     Constitutional: Alert and oriented. Well appearing and in no acute distress. Eyes: Conjunctivae are normal. EOMI. Head: Atraumatic. Nose: No congestion/rhinnorhea. Mouth/Throat: Mucous membranes are moist.   Neck: No stridor. Trachea Midline. FROM Cardiovascular: Normal rate, regular rhythm. Grossly normal heart sounds.  Good peripheral circulation. Respiratory: Normal respiratory effort.  No retractions. Lungs CTAB. Gastrointestinal: Soft and nontender. No distention. No abdominal bruits.  Musculoskeletal: No  lower extremity tenderness nor edema.  No joint effusions. Neurologic:  Normal speech and language. No gross focal neurologic deficits are appreciated.  Skin:  Skin is warm, dry and intact. No rash noted. Psychiatric: Mood and affect are normal. Speech and behavior are normal. GU: Deferred   ____________________________________________   LABS (all labs ordered are listed, but only abnormal results are displayed)  Labs Reviewed - No data to display ____________________________________________   ED ECG REPORT I, Concha SeMary E Sirius Woodford, the attending physician, personally viewed and interpreted this ECG.  EKG is normal sinus rate of  75, little bit of ST elevation diffusely but no reciprocal changes, normal intervals ____________________________________________  RADIOLOGY Vela ProseI, Ayrton Mcvay E Noga Fogg, personally viewed and evaluated these images (plain radiographs) as part of my medical decision making, as well as reviewing the written report by the radiologist.  ED MD interpretation: air noted in mediastinum   Official radiology report(s): Dg Chest 2 View  Result Date: 12/28/2018 CLINICAL DATA:  Acute chest pain after esophageal dilation which was performed earlier today. EXAM: CHEST - 2 VIEW COMPARISON:  CTA chest 11/20/2018. Chest x-ray 11/19/2018 and earlier. FINDINGS: Cardiac silhouette normal in size. Pneumomediastinum is present diffusely, extending into the LOWER neck. Mild linear atelectasis at the LEFT lung base. Lungs otherwise clear. No pneumothorax. Visualized bony thorax intact. IMPRESSION: 1. Pneumomediastinum. 2. Mild linear atelectasis at the LEFT lung base. No acute cardiopulmonary disease otherwise. No evidence of pneumothorax. These results were called by telephone at the time of interpretation on 12/28/2018 shortly after 3:30 pm to [facility], who verbally acknowledged these results. Electronically Signed   By: Hulan Saashomas  Lawrence M.D.   On: 12/28/2018 15:35   Dg Abd 1 View  Result Date: 12/28/2018 CLINICAL DATA:  Acute onset of chest pain and abdominal pain after esophageal dilation which was performed earlier today. EXAM: ABDOMEN - 1 VIEW COMPARISON:  None. FINDINGS: Bowel gas pattern unremarkable without evidence of obstruction or significant ileus. Expected stool burden in the colon. There may be a small amount of pneumoretroperitoneum in the upper abdomen, as an extension of the pneumomediastinum identified on the concurrent chest x-ray. No visible opaque urinary tract calculi. Regional skeleton unremarkable. IMPRESSION: Possible small amount of pneumoretroperitoneum in the upper abdomen, as an  extension of the pneumomediastinum identified on the concurrent chest x-ray. No acute abdominal abnormality otherwise. Electronically Signed   By: Hulan Saashomas  Lawrence M.D.   On: 12/28/2018 15:34   Ct Chest W Contrast  Result Date: 12/28/2018 CLINICAL DATA:  48 year old who underwent esophageal dilation earlier today and had a chest x-ray demonstrating pneumomediastinum. CT is requested to evaluate for possible esophageal tear. EXAM: CT CHEST WITH CONTRAST TECHNIQUE: Multidetector CT imaging of the chest was performed during intravenous contrast administration. The patient was given water-soluble oral contrast medially prior to imaging. CONTRAST:  75mL OMNIPAQUE IOHEXOL 300 MG/ML IV. COMPARISON:  CTA chest 11/20/2018. FINDINGS: Cardiovascular: Normal heart size. No visible coronary atherosclerosis. No pericardial effusion. No visible thoracic or proximal abdominal aortic atherosclerosis. Mediastinum/Nodes: Extensive pneumomediastinum. Gas extends into the soft tissues of the LOWER neck. No extraluminal oral contrast within the mediastinum to confirm an esophageal tear. There is pneumatosis of the esophageal wall and the esophageal lumen is completely collapsed. I do not identify a gas-filled defect in the esophageal wall to confirm a site of tear/rupture. No pathologic lymphadenopathy. Visualized thyroid gland normal in appearance. Lungs/Pleura: No pneumothorax. Lung parenchyma clear without confluent or ground-glass airspace consolidation. No parenchymal nodules or masses in either lung.  No evidence of interstitial lung disease. Central airways patent without significant bronchial wall thickening. No pleural effusions. No pleural plaques or masses. Upper Abdomen: Small amount of pneumatosis involving the wall of the greater curvature of the fundus of the stomach. Small amount of air in the gastrohepatic ligament. No extraluminal oral contrast. Visualized upper abdomen otherwise unremarkable. Musculoskeletal:  Regional skeleton unremarkable without acute or significant osseous abnormality. IMPRESSION: 1. Extensive pneumomediastinum. No visible extraluminal oral contrast within the mediastinum to confirm an esophageal tear/rupture. 2. Pneumatosis involving the esophageal wall and involving the wall of the greater curvature of the stomach. 3. No acute cardiopulmonary disease. Electronically Signed   By: Evangeline Dakin M.D.   On: 12/28/2018 17:33    ____________________________________________   PROCEDURES  Procedure(s) performed (including Critical Care):  Procedures   ____________________________________________   INITIAL IMPRESSION / ASSESSMENT AND PLAN / ED COURSE   Walter Baldwin was evaluated in Emergency Department on 12/28/2018 for the symptoms described in the history of present illness. He was evaluated in the context of the global COVID-19 pandemic, which necessitated consideration that the patient might be at risk for infection with the SARS-CoV-2 virus that causes COVID-19. Institutional protocols and algorithms that pertain to the evaluation of patients at risk for COVID-19 are in a state of rapid change based on information released by regulatory bodies including the CDC and federal and state organizations. These policies and algorithms were followed during the patient's care in the ED.    Status post endoscopy with CT confirming pneumomediastinum.  Will discuss with GI and plan for transfer  DDx that was also considered d/t potential to cause harm, but was found less likely based on history and physical (as detailed above): -PNA (no fevers, cough but CXR to evaluate) -Symptomatic anemia (will get H&H) -Pulmonary embolism as no sob at rest, not pleuritic in nature, no hypoxia -Aortic Dissection as no tearing pain and no radiation to the mid back, pulses equal -Pericarditis no rub on exam, EKG changes or hx to suggest dx -Tamponade (no notable SOB, tachycardic,  hypotensive) -Esophageal rupture (no h/o diffuse vomitting/no crepitus)  D/w Dr. Allen Norris who recommend transfer to [facility]. Dr. Genevive Bi are CT surgery doc agreed with transfer out.   7:26 PM D/w Dr. Arlie Solomons hospitalist at [facility].  There are no beds available but they have accepted patient to wait list.  7:30 PM D/w Dr lightfood CT surgery at [facility] and would probably just watch overnight. Recommend zosyn and observation with them. Therefore no emergent need to transfer.   Dr. Allen Norris aware pt is still waiting in ED for transfer.   Patient handed off to oncoming team pending transfer to [facility].  Will start some maintenance fluids.  D/w [facility], no beds over there as well.  D/w Dr. Maryjane Hurter CT surgery and agreed okay to watch patient overnight and that doesn't need immediate surgery for this.   Pt remains on waitlist at [facility] and handed off pending transfer there.    ____________________________________________   FINAL CLINICAL IMPRESSION(S) / ED DIAGNOSES   Final diagnoses:  Pneumomediastinum ([place])     MEDICATIONS GIVEN DURING THIS VISIT:  Medications  piperacillin-tazobactam (ZOSYN) IVPB 3.375 g (has no administration in time range)  0.9 %  sodium chloride infusion (has no administration in time range)  fentaNYL (SUBLIMAZE) injection 50 mcg (has no administration in time range)  fentaNYL (SUBLIMAZE) injection 50 mcg (50 mcg Intravenous Given 12/28/18 1908)  ondansetron (ZOFRAN) injection 4 mg (4 mg Intravenous Given  12/28/18 1908)     ED Discharge Orders    None       Note:  This document was prepared using Dragon voice recognition software and may include unintentional dictation errors.   Concha Se, MD 12/28/18 2023    Concha Se, MD 12/28/18 2203

## 2018-12-28 NOTE — Anesthesia Post-op Follow-up Note (Signed)
Anesthesia QCDR form completed.        

## 2018-12-28 NOTE — ED Notes (Signed)
Pt laying in bed watching tv, no c/o pain, offered blankets but pt declined

## 2018-12-28 NOTE — ED Notes (Signed)
Pt arrives from triage in NAD, A&Ox4. Reports chest pain in central chest and as well difficulty taking a deep breath. PT speaking in sentences without gasping for breath.

## 2018-12-28 NOTE — Addendum Note (Signed)
Addended by: Vonda Antigua on: 12/28/2018 04:15 PM   Modules accepted: Orders

## 2018-12-28 NOTE — ED Notes (Addendum)
Blue, lavender, light green, grey tubes sent to lab. Dr. Jari Pigg at bedside

## 2018-12-28 NOTE — Transfer of Care (Signed)
Immediate Anesthesia Transfer of Care Note  Patient: Walter Baldwin  Procedure(s) Performed: ESOPHAGOGASTRODUODENOSCOPY (EGD) WITH PROPOFOL (N/A )  Patient Location: PACU  Anesthesia Type:General  Level of Consciousness: sedated  Airway & Oxygen Therapy: Patient Spontanous Breathing and Patient connected to nasal cannula oxygen  Post-op Assessment: Report given to RN and Post -op Vital signs reviewed and stable  Post vital signs: Reviewed and stable  Last Vitals:  Vitals Value Taken Time  BP 104/68 12/28/18 0843  Temp 36.1 C 12/28/18 0842  Pulse 56 12/28/18 0843  Resp 15 12/28/18 0843  SpO2 97 % 12/28/18 0843  Vitals shown include unvalidated device data.  Last Pain:  Vitals:   12/28/18 0842  TempSrc: Tympanic  PainSc: Asleep         Complications: No apparent anesthesia complications

## 2018-12-28 NOTE — Addendum Note (Signed)
Addended by: Vonda Antigua on: 12/28/2018 04:16 PM   Modules accepted: Orders

## 2018-12-28 NOTE — Anesthesia Preprocedure Evaluation (Signed)
Anesthesia Evaluation  Patient identified by MRN, date of birth, ID band Patient awake    Reviewed: Allergy & Precautions, NPO status , Patient's Chart, lab work & pertinent test results  History of Anesthesia Complications Negative for: history of anesthetic complications  Airway Mallampati: II  TM Distance: >3 FB Neck ROM: Full    Dental no notable dental hx.    Pulmonary neg pulmonary ROS, neg sleep apnea, neg COPD,    breath sounds clear to auscultation- rhonchi (-) wheezing      Cardiovascular Exercise Tolerance: Good (-) hypertension(-) CAD and (-) Past MI  Rhythm:Regular Rate:Normal - Systolic murmurs and - Diastolic murmurs    Neuro/Psych negative neurological ROS  negative psych ROS   GI/Hepatic negative GI ROS, Neg liver ROS,   Endo/Other  negative endocrine ROSneg diabetes  Renal/GU negative Renal ROS     Musculoskeletal negative musculoskeletal ROS (+)   Abdominal (+) - obese,   Peds  Hematology negative hematology ROS (+)   Anesthesia Other Findings   Reproductive/Obstetrics                             Anesthesia Physical Anesthesia Plan  ASA: I  Anesthesia Plan: General   Post-op Pain Management:    Induction: Intravenous  PONV Risk Score and Plan: 1 and Propofol infusion  Airway Management Planned: Natural Airway  Additional Equipment:   Intra-op Plan:   Post-operative Plan:   Informed Consent: I have reviewed the patients History and Physical, chart, labs and discussed the procedure including the risks, benefits and alternatives for the proposed anesthesia with the patient or authorized representative who has indicated his/her understanding and acceptance.     Dental advisory given  Plan Discussed with: CRNA and Anesthesiologist  Anesthesia Plan Comments:        Anesthesia Quick Evaluation  

## 2018-12-28 NOTE — Anesthesia Procedure Notes (Signed)
Date/Time: 12/28/2018 8:13 AM Performed by: Johnna Acosta, CRNA Pre-anesthesia Checklist: Patient identified, Emergency Drugs available, Suction available, Patient being monitored and Timeout performed Patient Re-evaluated:Patient Re-evaluated prior to induction Oxygen Delivery Method: Nasal cannula Preoxygenation: Pre-oxygenation with 100% oxygen Induction Type: IV induction

## 2018-12-28 NOTE — Telephone Encounter (Signed)
Per Dr. Bonna Gains to do a Chest xray 2 views and Abdominal Xray 1 view. She said to do this STAT and patient should not take goody powder Advil, or Ibuprofen. Patient verablized understanding and states he will head to the [facility] now to have this done.

## 2018-12-28 NOTE — Telephone Encounter (Addendum)
Patient states he is having chest pain in the center of his chest. The chest pain started at 11am this morning after the procedure. States he can not take a deep breath with out it hurting really bad he can breath normal with mild discomfort. Patient states he has mild abdominal pain but thinks it is where he has not eaten. Patient took some goody powder for the pain since he unable to swallow the tylenol

## 2018-12-28 NOTE — Op Note (Addendum)
[facility] Patient Name: Walter Baldwin Procedure Date: 12/28/2018 7:21 AM MRN: [id number] Account #: [id number] Date of Birth: [birth year] Admit Type: Outpatient Age: 48 Room: [facility] ENDO ROOM 3 Gender: Male Note Status: Finalized Procedure:            Upper GI endoscopy Indications:          Dysphagia Providers:            [name] B. Bonna Gains MD, MD Medicines:            Monitored Anesthesia Care Complications:        No immediate complications. Procedure:            Pre-Anesthesia Assessment:                       - The risks and benefits of the procedure and the                        sedation options and risks were discussed with the                        patient. All questions were answered and informed                        consent was obtained.                       - Patient identification and proposed procedure were                        verified prior to the procedure.                       - ASA Grade Assessment: II - A patient with mild                        systemic disease.                       After obtaining informed consent, the endoscope was                        passed under direct vision. Throughout the procedure,                        the patient's blood pressure, pulse, and oxygen                        saturations were monitored continuously. The Endoscope                        was introduced through the mouth, and advanced to the                        lower third of esophagus. The Endoscope was introduced                        through the mouth, and advanced to the second part of                        duodenum. The upper GI endoscopy was accomplished with  ease. The patient tolerated the procedure well. Findings:      Mucosal changes including ringed esophagus, feline appearance,       longitudinal furrows, circumferential folds, stenosis and tight       circumferential folds were found in  the entire esophagus. Biopsies were       obtained from the proximal and distal esophagus with cold forceps for       histology of suspected eosinophilic esophagitis. The biopsies were taken       away from the areas of the stricture and after dilation. One set of       biopsies were done proximal to the 35 cm stricture, and another set was       taken between the 35 cm and 38 cm stricture sites.      Two benign-appearing, intrinsic stenoses were found one at 35 cm and one       at 38 cm. The stenoses were traversed. The stenosis at 35 cm was able to       be traversed with a regular EGD scope by gently maneuvering the scope.       Good heme effect was seen in this area after passing the scope. The       stenosis at 38 cm was dilated to 10mm with good heme effect and small       mucosal tear as expected. The regular EGD scope could not be passed       despite dilation and was changed to a slim scope which was able to       traverse the area. A TTS dilator was passed through the scope. Dilation       with an 10-10-08 mm x 5.5 cm CRE balloon dilator was performed to 10 mm.      A single area of ectopic gastric mucosa was found in the upper third of       the esophagus.      The entire examined stomach was normal. Biopsies were taken with a cold       forceps for Helicobacter pylori testing.      Patchy mildly erythematous mucosa without active bleeding and with no       stigmata of bleeding was found in the duodenal bulb. Biopsies were taken       with a cold forceps for histology.      The second portion of the duodenum was normal. Impression:           - Esophageal mucosal changes consistent with                        eosinophilic esophagitis. Biopsied.                       - Benign-appearing esophageal stenoses. Dilated.                       - Ectopic gastric mucosa in the upper third of the                        esophagus.                       - Normal stomach. Biopsied.                        - Erythematous duodenopathy. Biopsied.                       -  Normal second portion of the duodenum. Recommendation:       - Await pathology results.                       - Clear liquid diet today.                       - Soft diet for 7 days.                       - Use Prilosec (omeprazole) 40 mg PO BID (Pt given                        instructions to pick up the disintegrating tablets and                        take 2X20mg  BID.                       - Continue present medications.                       - Return to my office in 2 weeks.                       - Repeat upper endoscopy in 3 months to assess disease                        activity. Procedure Code(s):    --- Professional ---                       [id number], Esophagogastroduodenoscopy, flexible, transoral;                        with transendoscopic balloon dilation of esophagus                        (less than 30 mm diameter)                       43239, 59, Esophagogastroduodenoscopy, flexible,                        transoral; with biopsy, single or multiple Diagnosis Code(s):    --- Professional ---                       K22.8, Other specified diseases of esophagus                       K22.2, Esophageal obstruction                       Q40.2, Other specified congenital malformations of                        stomach                       K31.89, Other diseases of stomach and duodenum                       R13.10, Dysphagia, unspecified CPT copyright 2019 American Medical Association. All rights reserved. The codes documented in this  report are preliminary and upon coder review may  be revised to meet current compliance requirements.  Melodie Bouillon, MD Michel Bickers B. Maximino Greenland MD, MD 12/28/2018 9:02:09 AM This report has been signed electronically. Number of Addenda: 0 Note Initiated On: 12/28/2018 7:21 AM Estimated Blood Loss: Estimated blood loss: none.      [facility]

## 2018-12-28 NOTE — Progress Notes (Signed)
Results reviewed with patient over the phone and all questions answered. He is at the [place] location. NO contrast extravasation, no tear or rupture noted as per CT report. However, pneumomediastinum noted. Dr. Allen Norris is on call and is working on having patient sent to tertiary care center. Pt will wait with CT staff until he is sent to [facility] ER or another facility from the ER. Pt will need monitoring at tertiary care center and may need clips or stent placement endoscopically at the site or thoracic surgery intervention if indicated or clinical condition worsens. Pt verbalized understanding. Pt will remain NPO until seen by ED staff

## 2018-12-28 NOTE — Telephone Encounter (Signed)
[facility] Dr. Bonna Gains did prescription for tramadoll. Informed patient that he only needed to take this If he was in severe pain and not to take anything till we call him with results of the ct scan. Patient states he does not want the tramadol so I shreded the prescription.  Patient  verbalized understanding

## 2018-12-29 ENCOUNTER — Emergency Department: Payer: Managed Care, Other (non HMO)

## 2018-12-29 ENCOUNTER — Encounter: Payer: Self-pay | Admitting: Gastroenterology

## 2018-12-29 LAB — CBC WITH DIFFERENTIAL/PLATELET
Abs Immature Granulocytes: 0.02 10*3/uL (ref 0.00–0.07)
Basophils Absolute: 0 10*3/uL (ref 0.0–0.1)
Basophils Relative: 0 %
Eosinophils Absolute: 0.2 10*3/uL (ref 0.0–0.5)
Eosinophils Relative: 3 %
HCT: 41.7 % (ref 39.0–52.0)
Hemoglobin: 14.1 g/dL (ref 13.0–17.0)
Immature Granulocytes: 0 %
Lymphocytes Relative: 17 %
Lymphs Abs: 1.3 10*3/uL (ref 0.7–4.0)
MCH: 30.2 pg (ref 26.0–34.0)
MCHC: 33.8 g/dL (ref 30.0–36.0)
MCV: 89.3 fL (ref 80.0–100.0)
Monocytes Absolute: 0.5 10*3/uL (ref 0.1–1.0)
Monocytes Relative: 7 %
Neutro Abs: 5.6 10*3/uL (ref 1.7–7.7)
Neutrophils Relative %: 73 %
Platelets: 159 10*3/uL (ref 150–400)
RBC: 4.67 MIL/uL (ref 4.22–5.81)
RDW: 12.6 % (ref 11.5–15.5)
WBC: 7.6 10*3/uL (ref 4.0–10.5)
nRBC: 0 % (ref 0.0–0.2)

## 2018-12-29 LAB — SURGICAL PATHOLOGY

## 2018-12-29 LAB — COMPREHENSIVE METABOLIC PANEL
ALT: 25 U/L (ref 0–44)
AST: 18 U/L (ref 15–41)
Albumin: 3.8 g/dL (ref 3.5–5.0)
Alkaline Phosphatase: 54 U/L (ref 38–126)
Anion gap: 8 (ref 5–15)
BUN: 11 mg/dL (ref 6–20)
CO2: 27 mmol/L (ref 22–32)
Calcium: 8.6 mg/dL — ABNORMAL LOW (ref 8.9–10.3)
Chloride: 106 mmol/L (ref 98–111)
Creatinine, Ser: 1.06 mg/dL (ref 0.61–1.24)
GFR calc Af Amer: >60 mL/min (ref >60)
GFR calc non Af Amer: >60 mL/min (ref >60)
Glucose, Bld: 95 mg/dL (ref 70–99)
Potassium: 3.6 mmol/L (ref 3.5–5.1)
Sodium: 141 mmol/L (ref 135–145)
Total Bilirubin: 2.3 mg/dL — ABNORMAL HIGH (ref 0.3–1.2)
Total Protein: 6.6 g/dL (ref 6.5–8.1)

## 2018-12-29 LAB — TYPE AND SCREEN
ABO/RH(D): O POS
Antibody Screen: NEGATIVE

## 2018-12-29 MED ORDER — PANTOPRAZOLE SODIUM 40 MG IV SOLR
40.0000 mg | Freq: Two times a day (BID) | INTRAVENOUS | Status: DC
  Administered 2018-12-29: 40 mg via INTRAVENOUS
  Filled 2018-12-29: qty 40

## 2018-12-29 MED ORDER — IOHEXOL 300 MG/ML  SOLN
150.0000 mL | Freq: Once | INTRAMUSCULAR | Status: AC | PRN
  Administered 2018-12-29: 300 mL via ORAL

## 2018-12-29 MED ORDER — PIPERACILLIN-TAZOBACTAM 3.375 G IVPB
3.3750 g | Freq: Three times a day (TID) | INTRAVENOUS | Status: DC
  Administered 2018-12-29: 16:00:00 3.375 g via INTRAVENOUS
  Filled 2018-12-29: qty 50

## 2018-12-29 MED ORDER — ONDANSETRON HCL 4 MG/2ML IJ SOLN: 4.0000 mg | Freq: Three times a day (TID) | INTRAMUSCULAR | Status: DC | PRN

## 2018-12-29 MED ORDER — PIPERACILLIN-TAZOBACTAM 3.375 G IVPB
3.3750 g | Freq: Three times a day (TID) | INTRAVENOUS | Status: DC
  Administered 2018-12-29: 3.375 g via INTRAVENOUS
  Filled 2018-12-29: qty 50

## 2018-12-29 MED ORDER — LACTATED RINGERS IV SOLN: INTRAVENOUS | Status: DC

## 2018-12-29 NOTE — ED Notes (Signed)
Patient and spouse with questions regarding diagnosis and further potential procedure(s).  EDP notified, GI to speak with patient prior to transfer.

## 2018-12-29 NOTE — ED Notes (Signed)
Pt made aware he is to remain NPO at this time.

## 2018-12-29 NOTE — ED Notes (Signed)
Patient given apple juice and water per MD Malinda. Will try solids around 7:20pm

## 2018-12-29 NOTE — ED Provider Notes (Signed)
Dr. Kipp Brood called cardiothoracic surgery from [facility].  He says the barium swallow looks good wants me to try some food.  We will try clear liquids first.  With no pain he can go home per Dr. Kipp Brood.   Nena Polio, MD 12/29/18 [phone]

## 2018-12-29 NOTE — ED Provider Notes (Signed)
I assumed care of this patient while awaiting the scan.  Briefly, 48 year old male with mediastinal air after EGD.  I have discussed the case at length with CT surgery and GI.  Zosyn, IV fluids ordered.  Patient is to be kept n.p.o.  They recommended Gastrografin swallow, which was completed and is normal.  Currently awaiting callback from CT surgery regarding further treatment, as well as discussion of progression of diet and potential admission to [facility] if he remains stable, as opposed to transfer to [facility].   Duffy Bruce, MD 12/29/18 1630

## 2018-12-29 NOTE — ED Notes (Signed)
Patient able to eat crackers without any pain. EDP Malinda made aware.

## 2018-12-29 NOTE — Consult Note (Signed)
Cephas Darby, MD [address]  [place]  [place], [place] [address]  Main: [phone]  Fax: [phone] Pager: [phone]   Consultation  Referring Provider:     No ref. provider found Primary Care Physician:  Paulene Floor Primary Gastroenterologist:  Dr. Bonna Gains      Reason for Consultation: Esophageal perforation, pneumomediastinum  Date of Admission:  12/28/2018 Date of Consultation:  12/29/2018         HPI:   Walter Baldwin is a 48 y.o. male with no significant past medical history other than intermittent chest pressure, evaluated by Dr. Bonna Gains as outpatient for dysphagia, underwent upper endoscopy yesterday which revealed tight lower esophageal stricture, dilated with CRE balloon to 32m.  Endoscopy findings were concerning for eosinophilic esophagitis.  Postprocedure, patient was doing well.  Yesterday afternoon after he was discharged, he started experiencing severe chest pain, chest x-ray and abdominal x-ray revealed pneumomediastinum and possible small amount of normal retroperitoneum in the upper abdomen, subsequently underwent CT chest with contrast which confirmed extensive pneumomediastinum.  There was no visible extraluminal oral contrast within the mediastinum to confirm an esophageal tear/rupture.  There was presence of pneumatosis involving the esophageal wall and involving the wall of the greater curvature of the stomach.  [facility]  However waiting for the bed availability.  CT surgery recommended nonoperative management at this time.  Patient is currently n.p.o., receiving IV fluids as well as Zosyn and IV Protonix.  Patient has been hemodynamically stable overnight.  Labs revealed mild leukocytosis which resolved this morning  When I interviewed patient this morning, he denies chest pain.  He reports feeling significantly better.  He does not  smoke Patient had intermittent episodes of chest pain for which she was evaluated by cardiology and no evidence of coronary disease.  He also had CT chest PE protocol on 11/20/2018 which was negative  NSAIDs: None  Antiplts/Anticoagulants/Anti thrombotics: None  GI Procedures: EGD 12/28/2018 - Esophageal mucosal changes consistent with eosinophilic esophagitis. Biopsied. - Benign-appearing esophageal stenoses. Dilated to 144m - Ectopic gastric mucosa in the upper third of the esophagus. - Normal stomach. Biopsied. - Erythematous duodenopathy. Biopsied. - Normal second portion of the duodenum.  History reviewed. No pertinent past medical history.  Past Surgical History:  Procedure Laterality Date  . ESOPHAGOGASTRODUODENOSCOPY (EGD) WITH PROPOFOL N/A 12/28/2018   Procedure: ESOPHAGOGASTRODUODENOSCOPY (EGD) WITH PROPOFOL;  Surgeon: TaVirgel ManifoldMD;  Location: [facility] ENDOSCOPY;  Service: Endoscopy;  Laterality: N/A;  . NO PAST SURGERIES      Prior to Admission medications   Medication Sig Start Date End Date Taking? Authorizing Provider  omeprazole (PRILOSEC) 40 MG capsule Take 40 mg by mouth 2 (two) times daily.   Yes [provider]    Current Facility-Administered Medications:  .  fentaNYL (SUBLIMAZE) injection 50 mcg, 50 mcg, Intravenous, Q2H PRN, FuVanessa DurhamMD .  lactated ringers infusion, , Intravenous, Continuous, IsDuffy BruceMD, Last Rate: 100 mL/hr at 12/29/18 0927 .  ondansetron (ZOFRAN) injection 4 mg, 4 mg, Intravenous, Q8H PRN, IsDuffy BruceMD .  pantoprazole (PROTONIX) injection 40 mg, 40 mg, Intravenous, Q12H, IsDuffy BruceMD, 40 mg at 12/29/18 0925 .  piperacillin-tazobactam (ZOSYN) IVPB 3.375 g, 3.375 g, Intravenous, Q8H, IsDuffy BruceMD, Last Rate: 12.5 mL/hr at 12/29/18 0925, 3.375 g at 12/29/18 [id number] Outpatient Medications:  .  omeprazole (PRILOSEC)  40 MG capsule, Take 40 mg by mouth 2 (two) times daily., Disp: , Rfl:    Family History  Problem Relation Age of Onset  . Heart attack Mother   . Lung cancer Father   . Diabetes Brother      Social History   Tobacco Use  . Smoking status: Never Smoker  . Smokeless tobacco: Never Used  Substance Use Topics  . Alcohol use: Never    Frequency: Never  . Drug use: Never    Allergies as of 12/28/2018  . (No Known Allergies)    Review of Systems:    All systems reviewed and negative except where noted in HPI.   Physical Exam:  Vital signs in last 24 hours: Pulse Rate:  [51-81] 58 (10/27 1030) Resp:  [10-19] 17 (10/27 1030) BP: (101-138)/(64-86) 110/72 (10/27 1030) SpO2:  [92 %-100 %] 95 % (10/27 1030) Weight:  [74.8 kg] 74.8 kg (10/26 1839)   General:   Pleasant, cooperative in NAD Head:  Normocephalic and atraumatic. Eyes:   No icterus.   Conjunctiva pink. PERRLA. Ears:  Normal auditory acuity. Neck:  Supple; no masses or thyroidomegaly Lungs: Respirations even and unlabored. Lungs clear to auscultation bilaterally.   No wheezes, crackles, or rhonchi.  Heart:  Regular rate and rhythm;  Without murmur, clicks, rubs or gallops Abdomen:  Soft, nondistended, nontender. Normal bowel sounds. No appreciable masses or hepatomegaly.  No rebound or guarding.  Rectal:  Not performed. Msk:  Symmetrical without gross deformities.  Strength normal Extremities:  Without edema, cyanosis or clubbing. Neurologic:  Alert and oriented x3;  grossly normal neurologically. Skin:  Intact without significant lesions or rashes. Psych:  Alert and cooperative. Normal affect.  LAB RESULTS: CBC Latest Ref Rng & Units 12/29/2018 12/28/2018 12/04/2018  WBC 4.0 - 10.5 K/uL 7.6 13.8(H) 6.6  Hemoglobin 13.0 - 17.0 g/dL 14.1 15.6 16.0  Hematocrit 39.0 - 52.0 % 41.7 45.5 44.9  Platelets 150 - 400 K/uL 159 203 208    BMET BMP Latest Ref Rng & Units 12/29/2018 12/28/2018 12/04/2018  Glucose 70 - 99 mg/dL 95 117(H) 87  BUN 6 - 20 mg/dL 11 10 9  Creatinine 0.61 - 1.24 mg/dL  1.06 1.07 1.15  BUN/Creat Ratio 9 - 20 - - 8(L)  Sodium 135 - 145 mmol/L 141 139 141  Potassium 3.5 - 5.1 mmol/L 3.6 3.6 4.3  Chloride 98 - 111 mmol/L 106 102 102  CO2 22 - 32 mmol/L 27 27 26  Calcium 8.9 - 10.3 mg/dL 8.6(L) 9.2 9.4    LFT Hepatic Function Latest Ref Rng & Units 12/29/2018 12/04/2018 11/19/2018  Total Protein 6.5 - 8.1 g/dL 6.6 7.1 7.3  Albumin 3.5 - 5.0 g/dL 3.8 4.6 4.4  AST 15 - 41 U/L 18 23 25  ALT 0 - 44 U/L 25 35 39  Alk Phosphatase 38 - 126 U/L 54 74 65  Total Bilirubin 0.3 - 1.2 mg/dL 2.3(H) 1.0 1.4(H)     STUDIES: Dg Chest 2 View  Result Date: 12/28/2018 CLINICAL DATA:  Acute chest pain after esophageal dilation which was performed earlier today. EXAM: CHEST - 2 VIEW COMPARISON:  CTA chest 11/20/2018. Chest x-ray 11/19/2018 and earlier. FINDINGS: Cardiac silhouette normal in size. Pneumomediastinum is present diffusely, extending into the LOWER neck. Mild linear atelectasis at the LEFT lung base. Lungs otherwise clear. No pneumothorax. Visualized bony thorax intact. IMPRESSION: 1. Pneumomediastinum. 2. Mild linear atelectasis at the LEFT lung base. No acute cardiopulmonary disease otherwise. No evidence of   pneumothorax. These results were called by telephone at the time of interpretation on 12/28/2018 shortly after 3:30 pm to provider [facility], who verbally acknowledged these results. Electronically Signed   By: Evangeline Dakin M.D.   On: 12/28/2018 15:35   Dg Abd 1 View  Result Date: 12/28/2018 CLINICAL DATA:  Acute onset of chest pain and abdominal pain after esophageal dilation which was performed earlier today. EXAM: ABDOMEN - 1 VIEW COMPARISON:  None. FINDINGS: Bowel gas pattern unremarkable without evidence of obstruction or significant ileus. Expected stool burden in the colon. There may be a small amount of pneumoretroperitoneum in the upper abdomen, as an extension of the pneumomediastinum identified on the concurrent chest x-ray. No visible  opaque urinary tract calculi. Regional skeleton unremarkable. IMPRESSION: Possible small amount of pneumoretroperitoneum in the upper abdomen, as an extension of the pneumomediastinum identified on the concurrent chest x-ray. No acute abdominal abnormality otherwise. Electronically Signed   By: Evangeline Dakin M.D.   On: 12/28/2018 15:34   Ct Chest W Contrast  Result Date: 12/28/2018 CLINICAL DATA:  48 year old who underwent esophageal dilation earlier today and had a chest x-ray demonstrating pneumomediastinum. CT is requested to evaluate for possible esophageal tear. EXAM: CT CHEST WITH CONTRAST TECHNIQUE: Multidetector CT imaging of the chest was performed during intravenous contrast administration. The patient was given water-soluble oral contrast medially prior to imaging. CONTRAST:  34m OMNIPAQUE IOHEXOL 300 MG/ML IV. COMPARISON:  CTA chest 11/20/2018. FINDINGS: Cardiovascular: Normal heart size. No visible coronary atherosclerosis. No pericardial effusion. No visible thoracic or proximal abdominal aortic atherosclerosis. Mediastinum/Nodes: Extensive pneumomediastinum. Gas extends into the soft tissues of the LOWER neck. No extraluminal oral contrast within the mediastinum to confirm an esophageal tear. There is pneumatosis of the esophageal wall and the esophageal lumen is completely collapsed. I do not identify a gas-filled defect in the esophageal wall to confirm a site of tear/rupture. No pathologic lymphadenopathy. Visualized thyroid gland normal in appearance. Lungs/Pleura: No pneumothorax. Lung parenchyma clear without confluent or ground-glass airspace consolidation. No parenchymal nodules or masses in either lung. No evidence of interstitial lung disease. Central airways patent without significant bronchial wall thickening. No pleural effusions. No pleural plaques or masses. Upper Abdomen: Small amount of pneumatosis involving the wall of the greater curvature of the fundus of the stomach.  Small amount of air in the gastrohepatic ligament. No extraluminal oral contrast. Visualized upper abdomen otherwise unremarkable. Musculoskeletal: Regional skeleton unremarkable without acute or significant osseous abnormality. IMPRESSION: 1. Extensive pneumomediastinum. No visible extraluminal oral contrast within the mediastinum to confirm an esophageal tear/rupture. 2. Pneumatosis involving the esophageal wall and involving the wall of the greater curvature of the stomach. 3. No acute cardiopulmonary disease. Electronically Signed   By: TEvangeline DakinM.D.   On: 12/28/2018 17:33   Dg Esophagus W Single Cm (sol Or Thin Ba)  Result Date: 12/29/2018 CLINICAL DATA:  Pneumomediastinum. Concern for esophageal perforation following recent endoscopy with dilatation. EXAM: ESOPHOGRAM/BARIUM SWALLOW TECHNIQUE: Single contrast examination was performed using  water soluble. FLUOROSCOPY TIME:  Fluoroscopy Time:  1 minutes 12 seconds Radiation Exposure Index (if provided by the fluoroscopic device): 32.5 mGy COMPARISON:  CT 12/28/2018.  Chest x-ray 12/28/2018. FINDINGS: Pneumomediastinum again noted. No evidence of esophageal obstruction or significant narrowing. No evidence of esophageal perforation. Small sliding hiatal hernia noted. No reflux demonstrated. IMPRESSION: 1.  Pneumomediastinum again noted. 2.  No evidence of esophageal perforation. Electronically Signed   By: TMarcello Moores Register   On: 12/29/2018 12:52  Impression / Plan:   Walter Baldwin is a 48 y.o. male with history of chronic dysphagia to solids s/p EGD on 10/26, found to have tight distal esophageal stricture s/p CRE balloon dilation to 10 mm, complicated by pneumomediastinum.  Patient is currently asymptomatic  Maintain strict n.p.o. Continue IV Protonix 40 mg twice daily Continue Zosyn 3.375 g every 8 hours Maintenance IV fluids Patient underwent barium esophagogram today which did not reveal esophageal perforation Defer to  cardiothoracic surgery regarding timing of p.o. challenge  Thank you for involving me in the care of this patient.  We will follow along with you    LOS: 0 days   Rohini Vanga, MD  12/29/2018, 1:03 PM   Note: This dictation was prepared with Dragon dictation along with smaller phrase technology. Any transcriptional errors that result from this process are unintentional.  

## 2018-12-29 NOTE — ED Notes (Signed)
Pt ambulatory to toilet independently. 

## 2018-12-29 NOTE — Progress Notes (Signed)
Pharmacy Antibiotic Note  Walter Baldwin is a 48 y.o. male admitted on 12/28/2018 with possible perforated esophagus.  Pharmacy has been consulted for Zosyn dosing. Status post endoscopy with CT confirming pneumomediastinum  Plan: Zosyn 3.375g IV q8h (4 hour infusion).  Height: 5\' 9"  (175.3 cm) Weight: 165 lb (74.8 kg) IBW/kg (Calculated) : 70.7  No data recorded.  Recent Labs  Lab 12/28/18 1852  WBC 13.8*  CREATININE 1.07    Estimated Creatinine Clearance: 82.6 mL/min (by C-G formula based on SCr of 1.07 mg/dL).    No Known Allergies  Antimicrobials this admission: zosyn 10/27 >>       >>    Dose adjustments this admission:    Microbiology results:   BCx:     UCx:      Sputum:      MRSA PCR:    Thank you for allowing pharmacy to be a part of this patient's care.  Mirriam Vadala A 12/29/2018 8:49 AM

## 2018-12-29 NOTE — ED Provider Notes (Addendum)
Dr. Kipp Brood is no longer on call discussed the patient again with Dr. Mohammed Kindle.  Dr. Aris Lot recommends liquids today sending him home to advance to a soft diet for about a week as tolerated.  Let him advance himself as tolerated.  Patient himself is very anxious to get something more solid in his stomach.  He is tolerating liquids so far well.  If he continues this and he agrees to chew his food very well which she has and make a paste out of it which she has I will let him try a few swallows of something solid here rather than have him try it at home which I am sure he will do once he does get home.   Nena Polio, MD 12/29/18 1856 I have discussed this carefully with the patient and he realizes the need to make sure the food is well chewed and the risk of popping open any healing injury to the esophagus.   Nena Polio, MD 12/29/18 1857 Patient tolerating well chewed solids easily.   Nena Polio, MD 12/29/18 2100

## 2018-12-29 NOTE — Discharge Instructions (Addendum)
Drink clear liquids.  Gradually advance your diet to solids.  Keep the food soft.  Make sure as well chewed.  Return here immediately if you get any pain in your chest or upper stomach after eating.  Follow-up with your regular doctor later on this week.

## 2018-12-30 ENCOUNTER — Telehealth: Payer: Self-pay | Admitting: Gastroenterology

## 2018-12-30 NOTE — Telephone Encounter (Signed)
Pt called to schedule f/u apt with Dr. Bonna Gains he is schedule for 01/14/19. He wanted to let Dr. Bonna Gains know he is out of the hospital

## 2018-12-30 NOTE — Telephone Encounter (Signed)
Per Dr. Bonna Gains: Please tell him to take the Omeprazole disintegrating tablets like we had discussed with him. These come in 20 mg, he is to take 2 of these (40mg ), twice a day. Please make sure he repeats the instructions back to you properly. Soft diet for 1 week. If his symptoms, reoccur he should call us. Set up a telephone visit with me Tuesday of next week at 1 pm.   Patient verbalized understanding. Please make patient a telephone visit for Tuesday at 1pm

## 2019-01-05 ENCOUNTER — Other Ambulatory Visit: Payer: Self-pay

## 2019-01-05 ENCOUNTER — Ambulatory Visit (INDEPENDENT_AMBULATORY_CARE_PROVIDER_SITE_OTHER): Payer: Managed Care, Other (non HMO) | Admitting: Gastroenterology

## 2019-01-05 ENCOUNTER — Encounter: Payer: Self-pay | Admitting: Gastroenterology

## 2019-01-05 ENCOUNTER — Telehealth: Payer: Self-pay

## 2019-01-05 DIAGNOSIS — K200 Eosinophilic esophagitis: Secondary | ICD-10-CM

## 2019-01-05 DIAGNOSIS — K222 Esophageal obstruction: Secondary | ICD-10-CM

## 2019-01-05 NOTE — Telephone Encounter (Signed)
Per Dr. Bonna Gains from visit today Walter Baldwin - he wants to cancel his Nov office visit since he did a telephone visit today, which is fine. He will keep his Dec clinic visit. Set him for an EGD toward the end of december for esophageal stricture. Send him EoE handout  Scheduled patient on 03/03/2019 and COVID test 03/01/2019. Canceled appointment for patient in November. 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 Mailed instruction and sent instructions to [employer]

## 2019-01-05 NOTE — Progress Notes (Signed)
Walter BouillonVarnita Victorino Fatzinger, MD [address]  [address]  [place], [place] [address]  Main: [phone]  Fax: [phone]   Primary Care Physician: Trey SailorsPollak, Adriana M, PA-C  Virtual Visit via Telephone Note  I connected with patient on 01/05/19 at  1:00 PM EST by telephone and verified that I am speaking with the correct person using two identifiers.   I discussed the limitations, risks, security and privacy concerns of performing an evaluation and management service by telephone and the availability of in person appointments. I also discussed with the patient that there may be a patient responsible charge related to this service. The patient expressed understanding and agreed to proceed.  Location of Patient: Home Location of Provider: Home Persons involved: Patient and provider only during the visit (nursing staff and front desk staff was involved in communicating with the patient prior to the appointment, reviewing medications and checking them in)   History of Present Illness: Chief Complaint  Patient presents with  . Hospitalization Follow-up    Patient states he is feeling better and is having no symptoms now      HPI: Walter Baldwin is a 48 y.o. male here for follow up of EoE. Pt recently underwent EGD for dysphagia and this showed:  Impression:           - Esophageal mucosal changes consistent with                        eosinophilic esophagitis. Biopsied.                       - Benign-appearing esophageal stenoses. Dilated.                       - Ectopic gastric mucosa in the upper third of the                        esophagus.                       - Normal stomach. Biopsied.                       - Erythematous duodenopathy. Biopsied.                       - Normal second portion of the duodenum.  Pathology with increased number of Eosinophils consistent with EoE  Pt has been taking Omeprazole 40mg  BID (disintergrating tablets) since 12/28/18. He denies any  dysphagia since the procedure and has been eating solid food without difficulty  Current Outpatient Medications  Medication Sig Dispense Refill  . omeprazole (PRILOSEC) 40 MG capsule Take 40 mg by mouth 2 (two) times daily.     No current facility-administered medications for this visit.     Allergies as of 01/05/2019  . (No Known Allergies)    Review of Systems:    All systems reviewed and negative except where noted in HPI.   Observations/Objective:  Labs: CMP     Component Value Date/Time   NA 141 12/29/2018 0924   NA 141 12/04/2018 1058   NA 140 08/03/2013 0647   K 3.6 12/29/2018 0924   K 3.8 08/03/2013 0647   CL 106 12/29/2018 0924   CL 104 08/03/2013 0647   CO2 27 12/29/2018 0924   CO2 30 08/03/2013 10270647  GLUCOSE 95 12/29/2018 0924   GLUCOSE 108 (H) 08/03/2013 0647   BUN 11 12/29/2018 0924   BUN 9 12/04/2018 1058   BUN 10 08/03/2013 0647   CREATININE 1.06 12/29/2018 0924   CREATININE 1.07 08/03/2013 0647   CALCIUM 8.6 (L) 12/29/2018 0924   CALCIUM 8.8 08/03/2013 0647   PROT 6.6 12/29/2018 0924   PROT 7.1 12/04/2018 1058   PROT 7.3 08/03/2013 0647   ALBUMIN 3.8 12/29/2018 0924   ALBUMIN 4.6 12/04/2018 1058   ALBUMIN 4.0 08/03/2013 0647   AST 18 12/29/2018 0924   AST 18 08/03/2013 0647   ALT 25 12/29/2018 0924   ALT 36 08/03/2013 0647   ALKPHOS 54 12/29/2018 0924   ALKPHOS 79 08/03/2013 0647   BILITOT 2.3 (H) 12/29/2018 0924   BILITOT 1.0 12/04/2018 1058   BILITOT 1.2 (H) 08/03/2013 0647   GFRNONAA >60 12/29/2018 0924   GFRNONAA >60 08/03/2013 0647   GFRAA >60 12/29/2018 0924   GFRAA >60 08/03/2013 0647   Lab Results  Component Value Date   WBC 7.6 12/29/2018   HGB 14.1 12/29/2018   HCT 41.7 12/29/2018   MCV 89.3 12/29/2018   PLT 159 12/29/2018    Imaging Studies: Dg Chest 2 View  Result Date: 12/28/2018 CLINICAL DATA:  Acute chest pain after esophageal dilation which was performed earlier today. EXAM: CHEST - 2 VIEW COMPARISON:  CTA chest  11/20/2018. Chest x-ray 11/19/2018 and earlier. FINDINGS: Cardiac silhouette normal in size. Pneumomediastinum is present diffusely, extending into the LOWER neck. Mild linear atelectasis at the LEFT lung base. Lungs otherwise clear. No pneumothorax. Visualized bony thorax intact. IMPRESSION: 1. Pneumomediastinum. 2. Mild linear atelectasis at the LEFT lung base. No acute cardiopulmonary disease otherwise. No evidence of pneumothorax. These results were called by telephone at the time of interpretation on 12/28/2018 shortly after 3:30 pm to provider [employer], who verbally acknowledged these results. Electronically Signed   By: Evangeline Dakin M.D.   On: 12/28/2018 15:35   Dg Abd 1 View  Result Date: 12/28/2018 CLINICAL DATA:  Acute onset of chest pain and abdominal pain after esophageal dilation which was performed earlier today. EXAM: ABDOMEN - 1 VIEW COMPARISON:  None. FINDINGS: Bowel gas pattern unremarkable without evidence of obstruction or significant ileus. Expected stool burden in the colon. There may be a small amount of pneumoretroperitoneum in the upper abdomen, as an extension of the pneumomediastinum identified on the concurrent chest x-ray. No visible opaque urinary tract calculi. Regional skeleton unremarkable. IMPRESSION: Possible small amount of pneumoretroperitoneum in the upper abdomen, as an extension of the pneumomediastinum identified on the concurrent chest x-ray. No acute abdominal abnormality otherwise. Electronically Signed   By: Evangeline Dakin M.D.   On: 12/28/2018 15:34   Ct Chest W Contrast  Result Date: 12/28/2018 CLINICAL DATA:  48 year old who underwent esophageal dilation earlier today and had a chest x-ray demonstrating pneumomediastinum. CT is requested to evaluate for possible esophageal tear. EXAM: CT CHEST WITH CONTRAST TECHNIQUE: Multidetector CT imaging of the chest was performed during intravenous contrast administration. The patient was given  water-soluble oral contrast medially prior to imaging. CONTRAST:  3mL OMNIPAQUE IOHEXOL 300 MG/ML IV. COMPARISON:  CTA chest 11/20/2018. FINDINGS: Cardiovascular: Normal heart size. No visible coronary atherosclerosis. No pericardial effusion. No visible thoracic or proximal abdominal aortic atherosclerosis. Mediastinum/Nodes: Extensive pneumomediastinum. Gas extends into the soft tissues of the LOWER neck. No extraluminal oral contrast within the mediastinum to confirm an esophageal tear. There is pneumatosis of the esophageal wall and  the esophageal lumen is completely collapsed. I do not identify a gas-filled defect in the esophageal wall to confirm a site of tear/rupture. No pathologic lymphadenopathy. Visualized thyroid gland normal in appearance. Lungs/Pleura: No pneumothorax. Lung parenchyma clear without confluent or ground-glass airspace consolidation. No parenchymal nodules or masses in either lung. No evidence of interstitial lung disease. Central airways patent without significant bronchial wall thickening. No pleural effusions. No pleural plaques or masses. Upper Abdomen: Small amount of pneumatosis involving the wall of the greater curvature of the fundus of the stomach. Small amount of air in the gastrohepatic ligament. No extraluminal oral contrast. Visualized upper abdomen otherwise unremarkable. Musculoskeletal: Regional skeleton unremarkable without acute or significant osseous abnormality. IMPRESSION: 1. Extensive pneumomediastinum. No visible extraluminal oral contrast within the mediastinum to confirm an esophageal tear/rupture. 2. Pneumatosis involving the esophageal wall and involving the wall of the greater curvature of the stomach. 3. No acute cardiopulmonary disease. Electronically Signed   By: Hulan Saas M.D.   On: 12/28/2018 17:33   Dg Esophagus W Single Cm (sol Or Thin Ba)  Result Date: 12/29/2018 CLINICAL DATA:  Pneumomediastinum. Concern for esophageal perforation  following recent endoscopy with dilatation. EXAM: ESOPHOGRAM/BARIUM SWALLOW TECHNIQUE: Single contrast examination was performed using  water soluble. FLUOROSCOPY TIME:  Fluoroscopy Time:  1 minutes 12 seconds Radiation Exposure Index (if provided by the fluoroscopic device): 32.5 mGy COMPARISON:  CT 12/28/2018.  Chest x-ray 12/28/2018. FINDINGS: Pneumomediastinum again noted. No evidence of esophageal obstruction or significant narrowing. No evidence of esophageal perforation. Small sliding hiatal hernia noted. No reflux demonstrated. IMPRESSION: 1.  Pneumomediastinum again noted. 2.  No evidence of esophageal perforation. Electronically Signed   By: Maisie Fus  Register   On: 12/29/2018 12:52    Assessment and Plan:   Walter Baldwin is a 48 y.o. y/o male here for follow-up of EOE  Assessment and Plan: Patient doing well post procedure and no further dysphagia with solid foods  Discussed need for repeat EGD to reevaluate stricture sites and perform repeat dilation if needed in 2 weeks  Patient is hesitant in repeating procedure at this time given pneumomediastinum, conservatively managed, after last EGD (no perforation on imaging studies with contrast)  However, he is open to getting the procedure done later on after his next clinic visit with [place]  We will try to schedule his procedure for the last 2 weeks in December as per his wishes  However, if symptoms recur in the meantime he was asked to notify us  Compliance with omeprazole encouraged and patient verbalized understanding  Follow Up Instructions: Follow-up in December 2020   I discussed the assessment and treatment plan with the patient. The patient was provided an opportunity to ask questions and all were answered. The patient agreed with the plan and demonstrated an understanding of the instructions.   The patient was advised to call back or seek an in-person evaluation if the symptoms worsen or if the condition fails to improve as  anticipated.  I provided 12 minutes of non-face-to-face time during this encounter. Additional time was spent in reviewing patient's chart, placing orders etc.   Pasty Spillers, MD  Speech recognition software was used to dictate this note.

## 2019-01-05 NOTE — Patient Instructions (Signed)
Esophageal Stricture  Esophageal stricture is a narrowing (stricture) of the esophagus. The esophagus is the part of the body that moves food and liquid from your mouth to your stomach. The esophagus can become narrow because of disease or damage to the area. This condition can make swallowing difficult, painful, or even impossible. It also makes choking more likely. What are the causes? The most common cause of this condition is gastroesophageal reflux disease (GERD). Normally, food travels down the esophagus and stays in the stomach to be digested. In GERD, food and stomach acid move back up into the esophagus. Over time, this causes scar tissue and leads to narrowing. Other causes of esophageal stricture include:  Scarring from swallowing (ingesting) a harmful substance.  Damage from medical instruments used in the esophagus.  Radiation therapy.  Cancer.  Inflammation of the esophagus. What increases the risk? You are more likely to develop an esophageal stricture if you have GERD or esophageal cancer. What are the signs or symptoms? Symptoms of this condition include:  Difficulty swallowing.  Pain when swallowing.  Burning pain or discomfort in the throat or chest (heartburn).  Vomiting or spitting up (regurgitating) food or liquids.  Unexplained weight loss. How is this diagnosed? This condition may be diagnosed based on:  Your symptoms and a physical exam.  Tests, such as: ? Upper endoscopy. Your health care provider will insert a flexible tube with a tiny camera on it (endoscope) into your esophagus to check for a stricture. A tissue sample (biopsy) may also be taken to be examined under a microscope. ? Esophageal pH monitoring. This test involves using a tube to collect acid in the esophagus to determine how much stomach acid is entering the esophagus. ? Barium swallow test. For this test, you will drink a chalky liquid (barium solution) that coats the lining of the  esophagus. Then you will have an X-ray taken. The barium solution helps to show if there is a stricture. How is this treated? Treatment for esophageal stricture depends on what is causing your condition and how severe your condition is. Treatment options include:  Esophageal dilation. In this procedure, a health care provider inserts an endoscope or a tool called a dilator into the esophagus to gently stretch it and make the opening wider.  Stents. In some cases, a health care provider may place a small device (stent) in the esophagus to keep it open.  Acid-blocking medicines. Taking these can help you manage GERD symptoms after an esophageal stricture. Controlling your GERD symptoms or being free of them can prevent the stricture from returning. Follow these instructions at home: Eating and drinking      Follow instructions from your health care provider about any diet changes.  Cut your food into small pieces, chew well, and eat slowly  Try to eat soft food that is easier to swallow.  Eat and drink only when you are sitting upright.  Do not drink alcohol. If you need help quitting, ask your health care provider.  Do not eat during the 3 hours before bedtime.  Do not overeat at meals.  Do not eat foods that can make reflux worse. These include: ? Fatty foods, such as red meat and processed foods. ? Spicy foods. ? Soda. ? Tomato products. ? Chocolate. General instructions  Take over-the-counter and prescription medicines only as told by your health care provider.  Do not use any products that contain nicotine or tobacco, such as cigarettes and e-cigarettes. If you need help   quitting, ask your health care provider.  Lose weight if you are overweight.  Wear loose, comfortable clothing.  When lying in bed, raise (elevate) your head with pillows. This will help to prevent your stomach contents from backing up into your esophagus while you sleep.  Keep all follow-up visits  as told by your health care provider. This is important. Contact a health care provider if:  You have problems eating or swallowing.  You regurgitate food and liquid.  Your symptoms do not improve with treatment. Get help right away if:  You can no longer keep down any food, drinks, or your saliva. Summary  Esophageal stricture is a narrowing of the part of the body that moves food and liquid from your mouth to your stomach (esophagus).  The esophagus can become narrow because of disease or damage to the area. This can make swallowing difficult, painful, or even impossible.  Treatment for esophageal stricture depends on what is causing your condition and how severe your condition is. In some cases, procedures may be done to make the opening of the esophagus wider or to place a stent in the esophagus to keep it open.  Do not drink alcohol, overeat at meals, or eat foods that can make reflux worse. This information is not intended to replace advice given to you by your health care provider. Make sure you discuss any questions you have with your health care provider. Document Released: 10/29/2005 Document Revised: 05/16/2017 Document Reviewed: 10/25/2016 Elsevier Patient Education  2020 [employer].

## 2019-01-06 ENCOUNTER — Telehealth: Payer: Self-pay

## 2019-01-06 NOTE — Telephone Encounter (Signed)
Pt's wife Izora Gala dropped off FMLA forms to be completed for to here to be out of work to care for pt. Forms were placed in Adriana's box. Izora Gala is requesting the forms be faxed to [facility] Fax# [phone] and she would like a call when the forms are completed so she can pick up a copy. Izora Gala also stated that she tried to get the GI office to complete the FMLA forms but was advised they don't complete forms, the PCP completes FMLA forms. Please advise. Thanks TNP

## 2019-01-07 NOTE — Telephone Encounter (Signed)
Wife was advised, tele-visit scheduled tomorrow with you at [facility]

## 2019-01-07 NOTE — Telephone Encounter (Signed)
Please schedule telephone visit I am going to need to review course of illness, dates requirements etc

## 2019-01-08 ENCOUNTER — Other Ambulatory Visit: Payer: Self-pay

## 2019-01-08 ENCOUNTER — Ambulatory Visit (INDEPENDENT_AMBULATORY_CARE_PROVIDER_SITE_OTHER): Payer: Managed Care, Other (non HMO) | Admitting: Physician Assistant

## 2019-01-08 DIAGNOSIS — Z20822 Contact with and (suspected) exposure to covid-19: Secondary | ICD-10-CM

## 2019-01-08 DIAGNOSIS — J982 Interstitial emphysema: Secondary | ICD-10-CM

## 2019-01-08 NOTE — Patient Instructions (Signed)
Upper Endoscopy, Adult °Upper endoscopy is a procedure to look inside the upper GI (gastrointestinal) tract. The upper GI tract is made up of: °· The part of the body that moves food from your mouth to your stomach (esophagus). °· The stomach. °· The first part of your small intestine (duodenum). °This procedure is also called esophagogastroduodenoscopy (EGD) or gastroscopy. In this procedure, your health care provider passes a thin, flexible tube (endoscope) through your mouth and down your esophagus into your stomach. A small camera is attached to the end of the tube. Images from the camera appear on a monitor in the exam room. During this procedure, your health care provider may also remove a small piece of tissue to be sent to a lab and examined under a microscope (biopsy). °Your health care provider may do an upper endoscopy to diagnose cancers of the upper GI tract. You may also have this procedure to find the cause of other conditions, such as: °· Stomach pain. °· Heartburn. °· Pain or problems when swallowing. °· Nausea and vomiting. °· Stomach bleeding. °· Stomach ulcers. °Tell a health care provider about: °· Any allergies you have. °· All medicines you are taking, including vitamins, herbs, eye drops, creams, and over-the-counter medicines. °· Any problems you or family members have had with anesthetic medicines. °· Any blood disorders you have. °· Any surgeries you have had. °· Any medical conditions you have. °· Whether you are pregnant or may be pregnant. °What are the risks? °Generally, this is a safe procedure. However, problems may occur, including: °· Infection. °· Bleeding. °· Allergic reactions to medicines. °· A tear or hole (perforation) in the esophagus, stomach, or duodenum. °What happens before the procedure? °Staying hydrated °Follow instructions from your health care provider about hydration, which may include: °· Up to 2 hours before the procedure - you may continue to drink clear  liquids, such as water, clear fruit juice, black coffee, and plain tea. ° °Eating and drinking restrictions °Follow instructions from your health care provider about eating and drinking, which may include: °· 8 hours before the procedure - stop eating heavy meals or foods, such as meat, fried foods, or fatty foods. °· 6 hours before the procedure - stop eating light meals or foods, such as toast or cereal. °· 6 hours before the procedure - stop drinking milk or drinks that contain milk. °· 2 hours before the procedure - stop drinking clear liquids. °Medicines °Ask your health care provider about: °· Changing or stopping your regular medicines. This is especially important if you are taking diabetes medicines or blood thinners. °· Taking medicines such as aspirin and ibuprofen. These medicines can thin your blood. Do not take these medicines unless your health care provider tells you to take them. °· Taking over-the-counter medicines, vitamins, herbs, and supplements. °General instructions °· Plan to have someone take you home from the hospital or clinic. °· If you will be going home right after the procedure, plan to have someone with you for 24 hours. °· Ask your health care provider what steps will be taken to help prevent infection. °What happens during the procedure? ° °· An IV will be inserted into one of your veins. °· You may be given one or more of the following: °? A medicine to help you relax (sedative). °? A medicine to numb the throat (local anesthetic). °· You will lie on your left side on an exam table. °· Your health care provider will pass the endoscope through   your mouth and down your esophagus. °· Your health care provider will use the scope to check the inside of your esophagus, stomach, and duodenum. Biopsies may be taken. °· The endoscope will be removed. °The procedure may vary among health care providers and hospitals. °What happens after the procedure? °· Your blood pressure, heart rate,  breathing rate, and blood oxygen level will be monitored until you leave the hospital or clinic. °· Do not drive for 24 hours if you were given a sedative during your procedure. °· When your throat is no longer numb, you may be given some fluids to drink. °· It is up to you to get the results of your procedure. Ask your health care provider, or the department that is doing the procedure, when your results will be ready. °Summary °· Upper endoscopy is a procedure to look inside the upper GI tract. °· During the procedure, an IV will be inserted into one of your veins. You may be given a medicine to help you relax. °· A medicine will be used to numb your throat. °· The endoscope will be passed through your mouth and down your esophagus. °This information is not intended to replace advice given to you by your health care provider. Make sure you discuss any questions you have with your health care provider. °Document Released: 02/16/2000 Document Revised: 08/13/2017 Document Reviewed: 07/21/2017 °Elsevier Patient Education © 2020 Elsevier Inc. ° °

## 2019-01-08 NOTE — Progress Notes (Signed)
       Patient: Walter Baldwin Male    DOB: February 14, 1971   48 y.o.   MRN: [id number] Visit Date: 01/08/2019  Today's Provider: Trinna Post, PA-C   Chief Complaint  Patient presents with  . FMLA Paperwork   Subjective:    Virtual Visit via Telephone Note  I connected with [place] on 01/08/19 at  4:00 PM EST by telephone and verified that I am speaking with the correct person using two identifiers.  Location: Patient: Home Provider: Office   I discussed the limitations, risks, security and privacy concerns of performing an evaluation and management service by telephone and the availability of in person appointments. I also discussed with the patient that there may be a patient responsible charge related to this service. The patient expressed understanding and agreed to proceed.  HPI    FMLA Paperwork Patient presents via telephone visit for [facility] paperwork fill out.  Patient went in for Endoscopy on 12/28/2018 and had esophageal stricture that was dilated. He had severe chest pain and abdominal pain after the procedure at which point he had a CXR which showed pneumomediastinum and a CT scan chest was ordered and confirmed pneumomediastinum and were not able to confirm an esophageal rupture. Barium swallow did not show esophageal perforation. He was transferred from [facility] ER to [facility]    No Known Allergies   Current Outpatient Medications:  .  omeprazole (PRILOSEC) 40 MG capsule, Take 40 mg by mouth 2 (two) times daily., Disp: , Rfl:   Review of Systems  Constitutional: Negative.   Respiratory: Negative.   Cardiovascular: Negative.   Genitourinary: Negative.     Social History   Tobacco Use  . Smoking status: Never Smoker  . Smokeless tobacco: Never Used  Substance Use Topics  . Alcohol use: Never    Frequency: Never      Objective:   There were no vitals taken for this visit. There were no vitals filed for this visit.There is no height or weight on  file to calculate BMI.   Physical Exam   No results found for any visits on 01/08/19.     Assessment & Plan    1. Pneumomediastinum ([place])  S/p esophageal dilation. Filled out FMLA and faxed forms.   I discussed the assessment and treatment plan with the patient. The patient was provided an opportunity to ask questions and all were answered. The patient agreed with the plan and demonstrated an understanding of the instructions.   The patient was advised to call back or seek an in-person evaluation if the symptoms worsen or if the condition fails to improve as anticipated.  I provided 15 minutes of non-face-to-face time during this encounter.  The entirety of the information documented in the History of Present Illness, Review of Systems and Physical Exam were personally obtained by me. Portions of this information were initially documented by [facility], CMA and reviewed by me for thoroughness and accuracy.   F/u PRN      Trinna Post, PA-C  [facility]

## 2019-01-09 LAB — NOVEL CORONAVIRUS, NAA: SARS-CoV-2, NAA: NOT DETECTED

## 2019-01-12 ENCOUNTER — Other Ambulatory Visit: Payer: Self-pay

## 2019-01-12 DIAGNOSIS — Z20822 Contact with and (suspected) exposure to covid-19: Secondary | ICD-10-CM

## 2019-01-14 ENCOUNTER — Ambulatory Visit: Payer: Managed Care, Other (non HMO) | Admitting: Gastroenterology

## 2019-01-14 LAB — NOVEL CORONAVIRUS, NAA: SARS-CoV-2, NAA: NOT DETECTED

## 2019-02-08 ENCOUNTER — Other Ambulatory Visit: Payer: Self-pay

## 2019-02-08 ENCOUNTER — Encounter: Payer: Self-pay | Admitting: Gastroenterology

## 2019-02-08 ENCOUNTER — Ambulatory Visit (INDEPENDENT_AMBULATORY_CARE_PROVIDER_SITE_OTHER): Payer: Managed Care, Other (non HMO) | Admitting: Gastroenterology

## 2019-02-08 VITALS — BP 147/90 | HR 55 | Temp 98.2°F | Wt 172.1 lb

## 2019-02-08 DIAGNOSIS — K200 Eosinophilic esophagitis: Secondary | ICD-10-CM

## 2019-02-10 NOTE — Progress Notes (Signed)
Vonda Antigua, MD [address]  [place]  [place], [place] [address]  Main: [phone]  Fax: [phone]   Primary Care Physician: Trinna Post, PA-C   Chief Complaint  Patient presents with  . Dysphagia    no symtoms     HPI: Walter Baldwin is a 48 y.o. male here for follow-up of EOE.  Patient doing very well with omeprazole daily.  Is taking 40 mg twice daily, dissolvable pills and doing well with that.  Has had no episodes of dysphagia since his last procedure.  No weight loss.  No nausea or vomiting  Last EGD consistent with eosinophilic esophagitis on biopsies.  Esophageal stenosis found and dilated during the procedure.  Ectopic gastric mucosa seen in the proximal esophagus.  Erythematous duodenopathy reported as well.  Current Outpatient Medications  Medication Sig Dispense Refill  . omeprazole (PRILOSEC) 40 MG capsule Take 40 mg by mouth 2 (two) times daily.     No current facility-administered medications for this visit.     Allergies as of 02/08/2019  . (No Known Allergies)    ROS:  General: Negative for anorexia, weight loss, fever, chills, fatigue, weakness. ENT: Negative for hoarseness, difficulty swallowing , nasal congestion. CV: Negative for chest pain, angina, palpitations, dyspnea on exertion, peripheral edema.  Respiratory: Negative for dyspnea at rest, dyspnea on exertion, cough, sputum, wheezing.  GI: See history of present illness. GU:  Negative for dysuria, hematuria, urinary incontinence, urinary frequency, nocturnal urination.  Endo: Negative for unusual weight change.    Physical Examination:   BP (!) 147/90 (BP Location: Left Arm, Patient Position: Sitting, Cuff Size: Normal)   Pulse (!) 55   Temp 98.2 F (36.8 C) (Oral)   Wt 172 lb 2 oz (78.1 kg)   BMI 25.42 kg/m   General: Well-nourished, well-developed in no acute distress.  Eyes: No icterus. Conjunctivae pink. Mouth: Oropharyngeal mucosa moist and pink , no  lesions erythema or exudate. Neck: Supple, Trachea midline Abdomen: Bowel sounds are normal, nontender, nondistended, no hepatosplenomegaly or masses, no abdominal bruits or hernia , no rebound or guarding.   Extremities: No lower extremity edema. No clubbing or deformities. Neuro: Alert and oriented x 3.  Grossly intact. Skin: Warm and dry, no jaundice.   Psych: Alert and cooperative, normal mood and affect.   Labs: CMP     Component Value Date/Time   NA 141 12/29/2018 0924   NA 141 12/04/2018 1058   NA 140 08/03/2013 0647   K 3.6 12/29/2018 0924   K 3.8 08/03/2013 0647   CL 106 12/29/2018 0924   CL 104 08/03/2013 0647   CO2 27 12/29/2018 0924   CO2 30 08/03/2013 0647   GLUCOSE 95 12/29/2018 0924   GLUCOSE 108 (H) 08/03/2013 0647   BUN 11 12/29/2018 0924   BUN 9 12/04/2018 1058   BUN 10 08/03/2013 0647   CREATININE 1.06 12/29/2018 0924   CREATININE 1.07 08/03/2013 0647   CALCIUM 8.6 (L) 12/29/2018 0924   CALCIUM 8.8 08/03/2013 0647   PROT 6.6 12/29/2018 0924   PROT 7.1 12/04/2018 1058   PROT 7.3 08/03/2013 0647   ALBUMIN 3.8 12/29/2018 0924   ALBUMIN 4.6 12/04/2018 1058   ALBUMIN 4.0 08/03/2013 0647   AST 18 12/29/2018 0924   AST 18 08/03/2013 0647   ALT 25 12/29/2018 0924   ALT 36 08/03/2013 0647   ALKPHOS 54 12/29/2018 0924   ALKPHOS 79 08/03/2013 0647   BILITOT 2.3 (H) 12/29/2018 6606  BILITOT 1.0 12/04/2018 1058   BILITOT 1.2 (H) 08/03/2013 0647   GFRNONAA >60 12/29/2018 0924   GFRNONAA >60 08/03/2013 0647   GFRAA >60 12/29/2018 0924   GFRAA >60 08/03/2013 0647   Lab Results  Component Value Date   WBC 7.6 12/29/2018   HGB 14.1 12/29/2018   HCT 41.7 12/29/2018   MCV 89.3 12/29/2018   PLT 159 12/29/2018    Imaging Studies: No results found.  Assessment and Plan:   Walter Baldwin is a 48 y.o. y/o male here for follow-up of EOE  In clinical remission with omeprazole twice daily  Given how tight his strictures were from EOE on last exam, it is  important to reevaluate the site to ensure that this is improving on current regimen and evaluate for endoscopic and histologic remission to prevent relapse  Patient willing to undergo his procedure at the end of this month  I have discussed alternative options, risks & benefits,  which include, but are not limited to, bleeding, infection, perforation,respiratory complication & drug reaction.  The patient agrees with this plan & written consent will be obtained.    He is due for screening colonoscopy as well, however, it would be best to address his EOE first given how severe his symptoms and strictures were before attempting a second procedure which is elective.  Dr Melodie Bouillon

## 2019-03-01 ENCOUNTER — Other Ambulatory Visit: Payer: Self-pay

## 2019-03-01 ENCOUNTER — Other Ambulatory Visit
Admission: RE | Admit: 2019-03-01 | Discharge: 2019-03-01 | Disposition: A | Discharge status: Home / Self Care | Payer: Managed Care, Other (non HMO) | Source: Ambulatory Visit | Attending: Gastroenterology | Admitting: Gastroenterology

## 2019-03-01 DIAGNOSIS — Z01812 Encounter for preprocedural laboratory examination: Secondary | ICD-10-CM | POA: Diagnosis present

## 2019-03-01 DIAGNOSIS — Z20828 Contact with and (suspected) exposure to other viral communicable diseases: Secondary | ICD-10-CM | POA: Diagnosis not present

## 2019-03-02 LAB — SARS CORONAVIRUS 2 (TAT 6-24 HRS): SARS Coronavirus 2: NEGATIVE

## 2019-03-03 ENCOUNTER — Other Ambulatory Visit: Payer: Self-pay

## 2019-03-03 ENCOUNTER — Ambulatory Visit
Admission: RE | Admit: 2019-03-03 | Discharge: 2019-03-03 | Disposition: A | Discharge status: Home / Self Care | Payer: Managed Care, Other (non HMO) | Attending: Gastroenterology | Admitting: Gastroenterology

## 2019-03-03 ENCOUNTER — Encounter: Payer: Self-pay | Admitting: Gastroenterology

## 2019-03-03 ENCOUNTER — Ambulatory Visit: Payer: Managed Care, Other (non HMO) | Admitting: Anesthesiology

## 2019-03-03 ENCOUNTER — Encounter
Admission: RE | Disposition: A | Discharge status: Home / Self Care | Payer: Self-pay | Source: Home / Self Care | Attending: Gastroenterology

## 2019-03-03 DIAGNOSIS — K222 Esophageal obstruction: Secondary | ICD-10-CM

## 2019-03-03 DIAGNOSIS — K200 Eosinophilic esophagitis: Secondary | ICD-10-CM

## 2019-03-03 DIAGNOSIS — Z79899 Other long term (current) drug therapy: Secondary | ICD-10-CM | POA: Insufficient documentation

## 2019-03-03 DIAGNOSIS — K3189 Other diseases of stomach and duodenum: Secondary | ICD-10-CM

## 2019-03-03 HISTORY — PX: ESOPHAGOGASTRODUODENOSCOPY (EGD) WITH PROPOFOL: SHX5813

## 2019-03-03 SURGERY — ESOPHAGOGASTRODUODENOSCOPY (EGD) WITH PROPOFOL
Anesthesia: General

## 2019-03-03 MED ORDER — PROPOFOL 10 MG/ML IV BOLUS
INTRAVENOUS | Status: AC
  Filled 2019-03-03: qty 20

## 2019-03-03 MED ORDER — SODIUM CHLORIDE 0.9 % IV SOLN: INTRAVENOUS | Status: DC

## 2019-03-03 MED ORDER — MIDAZOLAM HCL 2 MG/2ML IJ SOLN
INTRAMUSCULAR | Status: DC | PRN
  Administered 2019-03-03 (×2): 1 mg via INTRAVENOUS

## 2019-03-03 MED ORDER — PROPOFOL 10 MG/ML IV BOLUS
INTRAVENOUS | Status: DC | PRN
  Administered 2019-03-03: 50 mg via INTRAVENOUS

## 2019-03-03 MED ORDER — MIDAZOLAM HCL 2 MG/2ML IJ SOLN
INTRAMUSCULAR | Status: AC
  Filled 2019-03-03: qty 2

## 2019-03-03 MED ORDER — PROPOFOL 500 MG/50ML IV EMUL
INTRAVENOUS | Status: DC | PRN
  Administered 2019-03-03: 180 ug/kg/min via INTRAVENOUS

## 2019-03-03 NOTE — Anesthesia Post-op Follow-up Note (Signed)
Anesthesia QCDR form completed.        

## 2019-03-03 NOTE — Anesthesia Preprocedure Evaluation (Signed)
Anesthesia Evaluation  Patient identified by MRN, date of birth, ID band Patient awake    Reviewed: Allergy & Precautions, NPO status , Patient's Chart, lab work & pertinent test results  History of Anesthesia Complications Negative for: history of anesthetic complications  Airway Mallampati: II  TM Distance: >3 FB Neck ROM: Full    Dental no notable dental hx.    Pulmonary neg pulmonary ROS, neg sleep apnea, neg COPD,    breath sounds clear to auscultation- rhonchi (-) wheezing      Cardiovascular Exercise Tolerance: Good (-) hypertension(-) CAD, (-) Past MI, (-) Cardiac Stents and (-) CABG  Rhythm:Regular Rate:Normal - Systolic murmurs and - Diastolic murmurs    Neuro/Psych neg Seizures negative neurological ROS  negative psych ROS   GI/Hepatic Neg liver ROS, Hx of esophageal stricture, had pneumomediastinum after dilation    Endo/Other  negative endocrine ROSneg diabetes  Renal/GU negative Renal ROS     Musculoskeletal negative musculoskeletal ROS (+)   Abdominal (+) - obese,   Peds  Hematology negative hematology ROS (+)   Anesthesia Other Findings Past Medical History: 12/2018: Pneumomediastinum ([place])     Comment:  after endo with dilation   Reproductive/Obstetrics                             Anesthesia Physical Anesthesia Plan  ASA: II  Anesthesia Plan: General   Post-op Pain Management:    Induction: Intravenous  PONV Risk Score and Plan: 1 and Propofol infusion  Airway Management Planned: Natural Airway  Additional Equipment:   Intra-op Plan:   Post-operative Plan:   Informed Consent: I have reviewed the patients History and Physical, chart, labs and discussed the procedure including the risks, benefits and alternatives for the proposed anesthesia with the patient or authorized representative who has indicated his/her understanding and acceptance.     Dental  advisory given  Plan Discussed with: CRNA and Anesthesiologist  Anesthesia Plan Comments:         Anesthesia Quick Evaluation

## 2019-03-03 NOTE — Transfer of Care (Signed)
Immediate Anesthesia Transfer of Care Note  Patient: Walter Baldwin  Procedure(s) Performed: ESOPHAGOGASTRODUODENOSCOPY (EGD) WITH PROPOFOL (N/A )  Patient Location: PACU  Anesthesia Type:General  Level of Consciousness: sedated  Airway & Oxygen Therapy: Patient Spontanous Breathing and Patient connected to nasal cannula oxygen  Post-op Assessment: Report given to RN and Post -op Vital signs reviewed and stable  Post vital signs: Reviewed and stable  Last Vitals:  Vitals Value Taken Time  BP 110/71 03/03/19 1008  Temp 35.9 C 03/03/19 1008  Pulse 61 03/03/19 1010  Resp 15 03/03/19 1010  SpO2 95 % 03/03/19 1010  Vitals shown include unvalidated device data.  Last Pain:  Vitals:   03/03/19 1008  TempSrc: Temporal         Complications: No apparent anesthesia complications

## 2019-03-03 NOTE — Op Note (Signed)
[facility] Gastroenterology Patient Name: Walter CoderRonnie Baldwin Procedure Date: 03/03/2019 9:32 AM MRN: [id number] Account #: [id number] Date of Birth: 1970-12-14 Admit Type: Outpatient Age: [age] Room[facility] ROOM 2 Gender: Male Note Status: Finalized Procedure:             Upper GI endoscopy Indications:           Follow-up of eosinophilic esophagitis Providers:             Zriyah Kopplin B. Maximino Greenlandahiliani MD, MD Referring MD:          Lavella HammockAdriana M. Jodi MarblePollak (Referring MD) Medicines:             Monitored Anesthesia Care Complications:         No immediate complications. Procedure:             Pre-Anesthesia Assessment:                        - The risks and benefits of the procedure and the                         sedation options and risks were discussed with the                         patient. All questions were answered and informed                         consent was obtained.                        - Patient identification and proposed procedure were                         verified prior to the procedure.                        - ASA Grade Assessment: II - A patient with mild                         systemic disease.                        After obtaining informed consent, the endoscope was                         passed under direct vision. Throughout the procedure,                         the patient's blood pressure, pulse, and oxygen                         saturations were monitored continuously. The Endoscope                         was introduced through the mouth, and advanced to the                         second part of duodenum. The upper GI endoscopy was                         accomplished  with ease. The patient tolerated the                         procedure well. Findings:      Mucosal changes including ringed esophagus, feline appearance and       circumferential folds were found in the entire esophagus. Biopsies were       obtained from the proximal and  distal esophagus with cold forceps for       histology of suspected eosinophilic esophagitis.      The entire examined stomach was normal. Biopsies were obtained in the       gastric body, at the incisura and in the gastric antrum with cold       forceps for Helicobacter pylori testing.      Patchy mildly erythematous mucosa without active bleeding and with no       stigmata of bleeding was found in the duodenal bulb. Biopsies were taken       with a cold forceps for histology.      The second portion of the duodenum was normal. Impression:            - Esophageal mucosal changes consistent with                         eosinophilic esophagitis. Biopsied.                        - Normal stomach.                        - Erythematous duodenopathy. Biopsied.                        - Normal second portion of the duodenum.                        - Biopsies were obtained in the gastric body, at the                         incisura and in the gastric antrum. Recommendation:        - Await pathology results.                        - Continue present medications.                        - Return to my office in 1 week.                        - The findings and recommendations were discussed with                         the patient.                        - The findings and recommendations were discussed with                         the patient's family. Procedure Code(s):     --- Professional ---  56256, Esophagogastroduodenoscopy, flexible,                         transoral; with biopsy, single or multiple Diagnosis Code(s):     --- Professional ---                        K22.8, Other specified diseases of esophagus                        K31.89, Other diseases of stomach and duodenum                        L89.3, Eosinophilic esophagitis CPT copyright 2019 American Medical Association. All rights reserved. The codes documented in this report are preliminary and upon Baldwin  review may  be revised to meet current compliance requirements.  Vonda Antigua, MD Margretta Sidle B. Bonna Gains MD, MD 03/03/2019 10:16:10 AM This report has been signed electronically. Number of Addenda: 0 Note Initiated On: 03/03/2019 9:32 AM Estimated Blood Loss:  Estimated blood loss: none.      [facility]

## 2019-03-03 NOTE — Anesthesia Procedure Notes (Signed)
Date/Time: 03/03/2019 9:30 AM Performed by: Allean Found, CRNA Pre-anesthesia Checklist: Patient identified, Emergency Drugs available, Suction available, Patient being monitored and Timeout performed Patient Re-evaluated:Patient Re-evaluated prior to induction Oxygen Delivery Method: Nasal cannula Placement Confirmation: positive ETCO2

## 2019-03-03 NOTE — Anesthesia Postprocedure Evaluation (Signed)
Anesthesia Post Note  Patient: Walter Baldwin  Procedure(s) Performed: ESOPHAGOGASTRODUODENOSCOPY (EGD) WITH PROPOFOL (N/A )  Patient location during evaluation: Endoscopy Anesthesia Type: General Level of consciousness: awake and alert and oriented Pain management: pain level controlled Vital Signs Assessment: post-procedure vital signs reviewed and stable Respiratory status: spontaneous breathing, nonlabored ventilation and respiratory function stable Cardiovascular status: blood pressure returned to baseline and stable Postop Assessment: no signs of nausea or vomiting Anesthetic complications: no     Last Vitals:  Vitals:   03/03/19 1008 03/03/19 1018  BP: 110/71 109/85  Pulse:    Resp:    Temp: (!) 35.9 C   SpO2:      Last Pain:  Vitals:   03/03/19 1018  TempSrc:   PainSc: 0-No pain                 Rian Busche

## 2019-03-03 NOTE — H&P (Signed)
Vonda Antigua, MD [address] [place], [place], [place], [address] [address], [place], [place], [place], [address] Phone: [phone]  Fax: [phone]  Primary Care Physician:  Trinna Post, PA-C   Pre-Procedure History & Physical: HPI:  Walter Baldwin is a 48 y.o. male is here for an EGD.   No past medical history on file.  Past Surgical History:  Procedure Laterality Date  . ESOPHAGOGASTRODUODENOSCOPY (EGD) WITH PROPOFOL N/A 12/28/2018   Procedure: ESOPHAGOGASTRODUODENOSCOPY (EGD) WITH PROPOFOL;  Surgeon: Virgel Manifold, MD;  Location: [facility] ENDOSCOPY;  Service: Endoscopy;  Laterality: N/A;  . NO PAST SURGERIES      Prior to Admission medications   Medication Sig Start Date End Date Taking? Authorizing Provider  omeprazole (PRILOSEC) 40 MG capsule Take 40 mg by mouth 2 (two) times daily.   Yes [provider]    Allergies as of 01/05/2019  . (No Known Allergies)    Family History  Problem Relation Age of Onset  . Heart attack Mother   . Lung cancer Father   . Diabetes Brother     Social History   Socioeconomic History  . Marital status: Married    Spouse name: Not on file  . Number of children: Not on file  . Years of education: Not on file  . Highest education level: Not on file  Occupational History  . Not on file  Tobacco Use  . Smoking status: Never Smoker  . Smokeless tobacco: Never Used  Substance and Sexual Activity  . Alcohol use: Never  . Drug use: Never  . Sexual activity: Not on file  Other Topics Concern  . Not on file  Social History Narrative  . Not on file   Social Determinants of Health   Financial Resource Strain:   . Difficulty of Paying Living Expenses: Not on file  Food Insecurity:   . Worried About Charity fundraiser in the Last Year: Not on file  . Ran Out of Food in the Last Year: Not on file  Transportation Needs:   . Lack of Transportation (Medical): Not on file  . Lack of  Transportation (Non-Medical): Not on file  Physical Activity:   . Days of Exercise per Week: Not on file  . Minutes of Exercise per Session: Not on file  Stress:   . Feeling of Stress : Not on file  Social Connections:   . Frequency of Communication with Friends and Family: Not on file  . Frequency of Social Gatherings with Friends and Family: Not on file  . Attends Religious Services: Not on file  . Active Member of Clubs or Organizations: Not on file  . Attends Archivist Meetings: Not on file  . Marital Status: Not on file  Intimate Partner Violence:   . Fear of Current or Ex-Partner: Not on file  . Emotionally Abused: Not on file  . Physically Abused: Not on file  . Sexually Abused: Not on file    Review of Systems: See HPI, otherwise negative ROS  Physical Exam: There were no vitals taken for this visit. General:   Alert,  pleasant and cooperative in NAD Head:  Normocephalic and atraumatic. Neck:  Supple; no masses or thyromegaly. Lungs:  Clear throughout to auscultation, normal respiratory effort.    Heart:  +S1, +S2, Regular rate and rhythm, No edema. Abdomen:  Soft, nontender and nondistended. Normal bowel sounds, without guarding, and without rebound.   Neurologic:  Alert and  oriented x4;  grossly normal neurologically.  Impression/Plan: Walter Baldwin is here for an EGD for EoE  Risks, benefits, limitations, and alternatives regarding the procedure have been reviewed with the patient.  Questions have been answered.  All parties agreeable.   Pasty Spillers, MD  03/03/2019, 8:50 AM

## 2019-03-04 ENCOUNTER — Encounter: Payer: Self-pay | Admitting: *Deleted

## 2019-03-04 LAB — SURGICAL PATHOLOGY

## 2019-03-08 ENCOUNTER — Telehealth: Payer: Self-pay

## 2019-03-08 ENCOUNTER — Other Ambulatory Visit: Payer: Self-pay

## 2019-03-08 ENCOUNTER — Other Ambulatory Visit: Payer: Self-pay | Admitting: Gastroenterology

## 2019-03-08 MED ORDER — OMEPRAZOLE 40 MG PO CPDR: DELAYED_RELEASE_CAPSULE | ORAL | 0 refills | Status: DC

## 2019-03-08 NOTE — Telephone Encounter (Signed)
Patient verbalized understanding of results. States he can swallow the capsule. Put patient on a recall list

## 2019-03-08 NOTE — Telephone Encounter (Signed)
-----   Message from Pasty Spillers, MD sent at 03/08/2019  1:30 PM EST ----- [name] please let the patient know, his biopsies showed improvement in the number of eosinophils from his previous procedure.  I have sent omeprazole capsules over to his pharmacy and he can take these twice a day for 60 days, and then decrease it to once a day after that.  If he starts having any symptoms or problems swallowing pills, to please let us know right away and stop taking the pill form and switch to the disintegrating tablet form at that time.  Set up clinic recall for 3 to 4 months

## 2019-03-08 NOTE — Telephone Encounter (Signed)
Patient states his insurance this year will only cover [facility] for his pharmacy. Sent medication to [facility] and called to cancel medication at [facility]

## 2019-06-23 ENCOUNTER — Other Ambulatory Visit: Payer: Self-pay | Admitting: Gastroenterology

## 2019-06-23 NOTE — Telephone Encounter (Signed)
Last office visit 02/08/2019 Eosinophilic esophagitis  Last refill 03/07/2018 0 refills  No appointment is scheduled  Please advised if patient needs medication

## 2019-06-23 NOTE — Telephone Encounter (Signed)
Originally sent to [facility] in error . Resent to [employer] [facility]

## 2019-06-23 NOTE — Telephone Encounter (Signed)
Patient called & l/m on v/m that he is unsure if he needs to continue taking omeprazole (PRILOSEC) 40 MG tcapsule. If so please call it into the [facility] on [address]

## 2019-06-24 MED ORDER — OMEPRAZOLE 40 MG PO CPDR: 40.0000 mg | DELAYED_RELEASE_CAPSULE | Freq: Two times a day (BID) | ORAL | 0 refills | Status: DC

## 2020-11-17 ENCOUNTER — Telehealth: Payer: Self-pay

## 2020-11-17 NOTE — Telephone Encounter (Signed)
Copied from CRM [id number]. Topic: Appointment Scheduling - Scheduling Inquiry for Clinic >> Nov 17, 2020  4:09 PM Daphine Deutscher D wrote: Reason for CRM: Pt needs to schedule an appt with Merita Norton for back pain.some time next week.  CB#  [phone]

## 2020-11-20 NOTE — Telephone Encounter (Signed)
Please review., KW 

## 2020-11-21 NOTE — Telephone Encounter (Signed)
Have tried multiple times to contact patient but the phone has a message that says "we are unable to complete this call".

## 2020-12-04 NOTE — Telephone Encounter (Signed)
Pt wife is calling and would like to make an appt for her husband to see elisa . Pt would like an appt after 2 pm. Please call wife back nancy

## 2020-12-05 ENCOUNTER — Ambulatory Visit: Payer: Managed Care, Other (non HMO) | Admitting: Family Medicine

## 2020-12-05 ENCOUNTER — Encounter: Payer: Self-pay | Admitting: Family Medicine

## 2020-12-05 ENCOUNTER — Other Ambulatory Visit: Payer: Self-pay

## 2020-12-05 VITALS — BP 138/92 | HR 69 | Temp 98.3°F | Wt 168.7 lb

## 2020-12-05 DIAGNOSIS — R030 Elevated blood-pressure reading, without diagnosis of hypertension: Secondary | ICD-10-CM | POA: Insufficient documentation

## 2020-12-05 DIAGNOSIS — M65819 Other synovitis and tenosynovitis, unspecified shoulder: Secondary | ICD-10-CM | POA: Diagnosis not present

## 2020-12-05 MED ORDER — DICLOFENAC SODIUM 1 % EX GEL: 4.0000 g | Freq: Four times a day (QID) | CUTANEOUS | 3 refills | Status: DC

## 2020-12-05 NOTE — Telephone Encounter (Signed)
Appt scheduled

## 2020-12-05 NOTE — Assessment & Plan Note (Signed)
BP elevated today Pt reports anxiety s/s MD visits [facility] Denies CP, SOB, LE edema, DOE

## 2020-12-05 NOTE — Assessment & Plan Note (Signed)
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

## 2020-12-05 NOTE — Progress Notes (Signed)
Established patient visit   Patient: Walter Baldwin   DOB: Feb 27, 1971   50 y.o. Male  MRN: [id number] Visit Date: 12/05/2020  Today's healthcare provider: Jacky Kindle, FNP   Chief Complaint  Patient presents with   Back Pain   Subjective    HPI HPI     Back Pain   This is a recurrent problem.  Recent episode started more than a month ago.  The problem has been unchanged since onset.  Pain is thoracic spine.  The quality of pain is described as aching and burning.  Pain occurs intermittently.  The symptoms are aggravated by position.  Symptoms are relieved by sitting (Patient reports sitting with arms supported).  Treatments: acetaminophen.  Treatment provided mild relief.  Abdominal Pain: Absent.  Bowel incontinence: Absent.  Chest pain: Absent.  Dysuria: Absent.  Fever: Absent.  Headaches: Absent.  Joint pains: Absent.  Weakness in leg: Absent.  Pelvic pain: Absent.  Tingling in lower extremities: Absent.  Urinary incontinence: Absent.  Weight loss: Absent.      Last edited by Fonda Kinder, CMA on 12/05/2020  2:35 PM.      Medications: Outpatient Medications Prior to Visit  Medication Sig   omeprazole (PRILOSEC) 40 MG capsule Take 1 capsule (40 mg total) by mouth 2 (two) times daily.   No facility-administered medications prior to visit.    Review of Systems     Objective    BP (!) 138/92   Pulse 69   Temp 98.3 F (36.8 C) (Oral)   Wt 168 lb 11.2 oz (76.5 kg)   BMI 24.21 kg/m  {Show previous vital signs (optional):23777}  Physical Exam Vitals and nursing note reviewed.  Constitutional:      Appearance: Normal appearance. He is overweight.  HENT:     Head: Normocephalic and atraumatic.  Eyes:     Pupils: Pupils are equal, round, and reactive to light.  Cardiovascular:     Rate and Rhythm: Normal rate and regular rhythm.     Pulses: Normal pulses.     Heart sounds: Normal heart sounds.  Pulmonary:     Effort: Pulmonary effort is normal.      Breath sounds: Normal breath sounds.  Musculoskeletal:        General: Normal range of motion.       Arms:     Cervical back: Normal range of motion.  Skin:    General: Skin is warm and dry.     Capillary Refill: Capillary refill takes less than 2 seconds.  Neurological:     General: No focal deficit present.     Mental Status: He is alert and oriented to person, place, and time. Mental status is at baseline.     No results found for any visits on 12/05/20.  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      Leilani Merl, FNP, have reviewed all documentation for this visit. The documentation on 12/05/20 for the exam, diagnosis, procedures, and orders are all accurate and complete.    Jacky Kindle, FNP  [facility] [phone] (phone) [phone] (fax)  [facility]

## 2021-02-22 ENCOUNTER — Encounter: Payer: Self-pay | Admitting: Family Medicine

## 2021-02-22 ENCOUNTER — Ambulatory Visit (INDEPENDENT_AMBULATORY_CARE_PROVIDER_SITE_OTHER): Payer: Managed Care, Other (non HMO) | Admitting: Family Medicine

## 2021-02-22 ENCOUNTER — Other Ambulatory Visit: Payer: Self-pay

## 2021-02-22 VITALS — BP 120/80 | HR 71 | Resp 16 | Ht 69.0 in | Wt 173.3 lb

## 2021-02-22 DIAGNOSIS — R030 Elevated blood-pressure reading, without diagnosis of hypertension: Secondary | ICD-10-CM

## 2021-02-22 DIAGNOSIS — Z0000 Encounter for general adult medical examination without abnormal findings: Secondary | ICD-10-CM | POA: Diagnosis not present

## 2021-02-22 DIAGNOSIS — M65919 Unspecified synovitis and tenosynovitis, unspecified shoulder: Secondary | ICD-10-CM

## 2021-02-22 DIAGNOSIS — Z131 Encounter for screening for diabetes mellitus: Secondary | ICD-10-CM | POA: Diagnosis not present

## 2021-02-22 DIAGNOSIS — R7989 Other specified abnormal findings of blood chemistry: Secondary | ICD-10-CM

## 2021-02-22 DIAGNOSIS — Z1159 Encounter for screening for other viral diseases: Secondary | ICD-10-CM | POA: Insufficient documentation

## 2021-02-22 DIAGNOSIS — Z13220 Encounter for screening for lipoid disorders: Secondary | ICD-10-CM | POA: Diagnosis not present

## 2021-02-22 DIAGNOSIS — R17 Unspecified jaundice: Secondary | ICD-10-CM

## 2021-02-22 DIAGNOSIS — Z1211 Encounter for screening for malignant neoplasm of colon: Secondary | ICD-10-CM

## 2021-02-22 DIAGNOSIS — M65819 Other synovitis and tenosynovitis, unspecified shoulder: Secondary | ICD-10-CM

## 2021-02-22 NOTE — Assessment & Plan Note (Signed)
Request for cologuard

## 2021-02-22 NOTE — Assessment & Plan Note (Signed)
Repeat chemistry 

## 2021-02-22 NOTE — Assessment & Plan Note (Signed)
Low risk screen °

## 2021-02-22 NOTE — Assessment & Plan Note (Signed)
Screening lipid- last lipid > 2 years ago Weight increase Borderline BP Family hx DM

## 2021-02-22 NOTE — Assessment & Plan Note (Signed)
UTD on dental and eye exam, got new glasses (same prescription) Things to do to keep yourself healthy  - Exercise at least 30-45 minutes a day, 3-4 days a week.  - Eat a low-fat diet with lots of fruits and vegetables, up to 7-9 servings per day.  - Seatbelts can save your life. Wear them always.  - Smoke detectors on every level of your home, check batteries every year.  - Eye Doctor - have an eye exam every 1-2 years  - Safe sex - if you may be exposed to STDs, use a condom.  - Alcohol -  If you drink, do it moderately, less than 2 drinks per day.  - Health Care Power of Attorney. Choose someone to speak for you if you are not able.  - Depression is common in our stressful world.If you're feeling down or losing interest in things you normally enjoy, please come in for a visit.  - Violence - If anyone is threatening or hurting you, please call immediately.

## 2021-02-22 NOTE — Assessment & Plan Note (Signed)
Failed injections at ortho Pain remains with out stretched motions Trial of mobic- little relief

## 2021-02-22 NOTE — Assessment & Plan Note (Signed)
Home readings stable Likely white coat Continue to monitor Recommend diet/exercise May start HCTZ to assist with control if home number start to go out of range

## 2021-02-22 NOTE — Assessment & Plan Note (Signed)
Slight weight increase; family hx of DM in brother A1c pending

## 2021-02-22 NOTE — Progress Notes (Signed)
Complete physical exam   Patient: Walter Baldwin   DOB: 1970-08-01   50 y.o. Male  MRN: [id number] Visit Date: 02/22/2021  Today's healthcare provider: Jacky Kindle, FNP   Chief Complaint  Patient presents with   Annual Exam   Subjective     HPI  Walter Baldwin is a 50 y.o. male who presents today for a complete physical exam.  He reports consuming a general diet. The patient has a physically strenuous job, but has no regular exercise apart from work.  He generally feels well. He reports sleeping fairly well. He does not have additional problems to discuss today.   Past Medical History:  Diagnosis Date   Pneumomediastinum (HCC) 12/2018   after endo with dilation   Past Surgical History:  Procedure Laterality Date   ESOPHAGOGASTRODUODENOSCOPY (EGD) WITH PROPOFOL N/A 12/28/2018   Procedure: ESOPHAGOGASTRODUODENOSCOPY (EGD) WITH PROPOFOL;  Surgeon: Pasty Spillers, MD;  Location: [facility] ENDOSCOPY;  Service: Endoscopy;  Laterality: N/A;   ESOPHAGOGASTRODUODENOSCOPY (EGD) WITH PROPOFOL N/A 03/03/2019   Procedure: ESOPHAGOGASTRODUODENOSCOPY (EGD) WITH PROPOFOL;  Surgeon: Pasty Spillers, MD;  Location: [facility] ENDOSCOPY;  Service: Endoscopy;  Laterality: N/A;   NO PAST SURGERIES     Social History   Socioeconomic History   Marital status: Married    Spouse name: Not on file   Number of children: Not on file   Years of education: Not on file   Highest education level: Not on file  Occupational History   Not on file  Tobacco Use   Smoking status: Never   Smokeless tobacco: Never  Vaping Use   Vaping Use: Never used  Substance and Sexual Activity   Alcohol use: Never   Drug use: Never   Sexual activity: Not on file  Other Topics Concern   Not on file  Social History Narrative   Not on file   Social Determinants of Health   Financial Resource Strain: Not on file  Food Insecurity: Not on file  Transportation Needs: Not on file  Physical Activity: Not on  file  Stress: Not on file  Social Connections: Not on file  Intimate Partner Violence: Not on file   Family Status  Relation Name Status   Mother  Deceased at age 76   Father  Deceased at age 34   Brother  Alive   Family History  Problem Relation Age of Onset   Heart attack Mother    Lung cancer Father    Diabetes Brother    No Known Allergies  Patient Care Team: Jacky Kindle, FNP as PCP - General (Family Medicine)   Medications: Outpatient Medications Prior to Visit  Medication Sig   diclofenac Sodium (VOLTAREN) 1 % GEL Apply 4 g topically 4 (four) times daily.   omeprazole (PRILOSEC) 40 MG capsule Take 1 capsule (40 mg total) by mouth 2 (two) times daily.   No facility-administered medications prior to visit.    Review of Systems  Musculoskeletal:  Positive for arthralgias and myalgias.  All other systems reviewed and are negative.    Objective    BP 120/80 Comment: home reading   Pulse 71    Resp 16    Ht  (1.753 m) Comment: patient reports   Wt 173 lb 4.8 oz (78.6 kg)    SpO2 99%    BMI 25.59 kg/m    Physical Exam Vitals and nursing note reviewed.  Constitutional:      General: He is awake.  He is not in acute distress.    Appearance: Normal appearance. He is well-developed, well-groomed and overweight. He is not ill-appearing, toxic-appearing or diaphoretic.  HENT:     Head: Normocephalic and atraumatic.     Jaw: There is normal jaw occlusion. No trismus, tenderness, swelling or pain on movement.     Salivary Glands: Right salivary gland is not diffusely enlarged or tender. Left salivary gland is not diffusely enlarged or tender.     Right Ear: Hearing, tympanic membrane, ear canal and external ear normal. There is no impacted cerumen.     Left Ear: Hearing, tympanic membrane, ear canal and external ear normal. There is no impacted cerumen.     Nose: Nose normal. No congestion or rhinorrhea.     Right Turbinates: Not enlarged, swollen or pale.     Left  Turbinates: Not enlarged, swollen or pale.     Right Sinus: No maxillary sinus tenderness or frontal sinus tenderness.     Left Sinus: No maxillary sinus tenderness or frontal sinus tenderness.     Mouth/Throat:     Lips: Pink.     Mouth: Mucous membranes are moist. No injury, lacerations, oral lesions or angioedema.     Pharynx: Oropharynx is clear. Uvula midline. No pharyngeal swelling, oropharyngeal exudate or posterior oropharyngeal erythema.     Tonsils: No tonsillar exudate or tonsillar abscesses.  Eyes:     General: Lids are normal. Vision grossly intact. Gaze aligned appropriately.        Right eye: No discharge.        Left eye: No discharge.     Extraocular Movements: Extraocular movements intact.     Conjunctiva/sclera: Conjunctivae normal.     Pupils: Pupils are equal, round, and reactive to light.  Neck:     Thyroid: No thyroid mass, thyromegaly or thyroid tenderness.     Vascular: No carotid bruit.     Trachea: Trachea normal. No tracheal tenderness.  Cardiovascular:     Rate and Rhythm: Normal rate and regular rhythm.     Pulses: Normal pulses.          Carotid pulses are 2+ on the right side and 2+ on the left side.      Radial pulses are 2+ on the right side and 2+ on the left side.       Femoral pulses are 2+ on the right side and 2+ on the left side.      Popliteal pulses are 2+ on the right side and 2+ on the left side.       Dorsalis pedis pulses are 2+ on the right side and 2+ on the left side.       Posterior tibial pulses are 2+ on the right side and 2+ on the left side.     Heart sounds: Normal heart sounds, S1 normal and S2 normal. No murmur heard.   No friction rub. No gallop.  Pulmonary:     Effort: Pulmonary effort is normal. No respiratory distress.     Breath sounds: Normal breath sounds and air entry. No stridor. No wheezing, rhonchi or rales.  Chest:     Chest wall: No tenderness.  Abdominal:     General: Abdomen is flat. Bowel sounds are normal.  There is no distension.     Palpations: Abdomen is soft. There is no mass.     Tenderness: There is no abdominal tenderness. There is no guarding or rebound.     Hernia: No hernia  is present.  Genitourinary:    Comments: Exam deferred; denies complaints Musculoskeletal:        General: No swelling, tenderness, deformity or signs of injury. Normal range of motion.     Cervical back: Normal range of motion and neck supple. No rigidity or tenderness.     Right lower leg: No edema.     Left lower leg: No edema.  Lymphadenopathy:     Cervical: No cervical adenopathy.     Right cervical: No superficial, deep or posterior cervical adenopathy.    Left cervical: No superficial, deep or posterior cervical adenopathy.  Skin:    General: Skin is warm and dry.     Capillary Refill: Capillary refill takes less than 2 seconds.     Coloration: Skin is not jaundiced or pale.     Findings: No bruising, erythema, lesion or rash.  Neurological:     General: No focal deficit present.     Mental Status: He is alert and oriented to person, place, and time. Mental status is at baseline.     GCS: GCS eye subscore is 4. GCS verbal subscore is 5. GCS motor subscore is 6.     Sensory: Sensation is intact. No sensory deficit.     Motor: Motor function is intact. No weakness.     Coordination: Coordination is intact.     Gait: Gait is intact.  Psychiatric:        Attention and Perception: Attention and perception normal.        Mood and Affect: Mood and affect normal.        Speech: Speech normal.        Behavior: Behavior normal. Behavior is cooperative.        Thought Content: Thought content normal.        Cognition and Memory: Cognition normal.        Judgment: Judgment normal.     Last depression screening scores PHQ 2/9 Scores 02/22/2021 12/05/2020 12/04/2018  PHQ - 2 Score 0 0 0  PHQ- 9 Score 1 2 0   Last fall risk screening Fall Risk  12/04/2018  Falls in the past year? 0  Number falls in past  yr: 0  Injury with Fall? 0  Follow up Falls evaluation completed   Last Audit-C alcohol use screening Alcohol Use Disorder Test (AUDIT) 12/04/2018  1. How often do you have a drink containing alcohol? 0  2. How many drinks containing alcohol do you have on a typical day when you are drinking? 0  3. How often do you have six or more drinks on one occasion? 0  AUDIT-C Score 0  Alcohol Brief Interventions/Follow-up AUDIT Score <7 follow-up not indicated   A score of 3 or more in women, and 4 or more in men indicates increased risk for alcohol abuse, EXCEPT if all of the points are from question 1   No results found for any visits on 02/22/21.  Assessment & Plan    Routine Health Maintenance and Physical Exam  Exercise Activities and Dietary recommendations  Goals   None     Immunization History  Administered Date(s) Administered   Influenza,inj,Quad PF,6+ Mos 12/04/2018   Tdap 12/04/2018    Health Maintenance  Topic Date Due   Hepatitis C Screening  Never done   COLONOSCOPY (Pts 45-17yrs Insurance coverage will need to be confirmed)  Never done   Zoster Vaccines- Shingrix (1 of 2) Never done   INFLUENZA VACCINE  06/02/2021 (Originally 10/02/2020)  TETANUS/TDAP  12/03/2028   HIV Screening  Completed   Pneumococcal Vaccine 48-26 Years old  Aged Out   HPV VACCINES  Aged Out    Discussed health benefits of physical activity, and encouraged him to engage in regular exercise appropriate for his age and condition.  Problem List Items Addressed This Visit       Musculoskeletal and Integument   Supraspinatus tenosynovitis    Failed injections at ortho Pain remains with out stretched motions Trial of mobic- little relief        Other   Elevated blood pressure reading without diagnosis of hypertension    Home readings stable Likely white coat Continue to monitor Recommend diet/exercise May start HCTZ to assist with control if home number start to go out of range       Annual physical exam - Primary    UTD on dental and eye exam, got new glasses (same prescription) Things to do to keep yourself healthy  - Exercise at least 30-45 minutes a day, 3-4 days a week.  - Eat a low-fat diet with lots of fruits and vegetables, up to 7-9 servings per day.  - Seatbelts can save your life. Wear them always.  - Smoke detectors on every level of your home, check batteries every year.  - Eye Doctor - have an eye exam every 1-2 years  - Safe sex - if you may be exposed to STDs, use a condom.  - Alcohol -  If you drink, do it moderately, less than 2 drinks per day.  - Health Care Power of Attorney. Choose someone to speak for you if you are not able.  - Depression is common in our stressful world.If you're feeling down or losing interest in things you normally enjoy, please come in for a visit.  - Violence - If anyone is threatening or hurting you, please call immediately.        Screening for colon cancer    Request for cologuard      Relevant Orders   Cologuard   Screening for diabetes mellitus    Slight weight increase; family hx of DM in brother A1c pending      Relevant Orders   Hemoglobin A1c   Screening, lipid    Screening lipid- last lipid > 2 years ago Weight increase Borderline BP Family hx DM      Relevant Orders   Lipid panel   Encounter for hepatitis C screening test for low risk patient    Low risk screen      Relevant Orders   Hepatitis C Antibody   Low serum calcium    Repeat chemistry      Relevant Orders   Comprehensive metabolic panel   Elevated bilirubin    Repeat chemistry      Relevant Orders   Comprehensive metabolic panel     Return in about 1 year (around 02/22/2022) for annual examination.     Leilani Merl, FNP, have reviewed all documentation for this visit. The documentation on 02/22/21 for the exam, diagnosis, procedures, and orders are all accurate and complete.    Jacky Kindle, FNP  [facility] [phone] (phone) [phone] (fax)  [facility]

## 2021-02-23 LAB — COMPREHENSIVE METABOLIC PANEL
ALT: 28 IU/L (ref 0–44)
AST: 22 IU/L (ref 0–40)
Albumin/Globulin Ratio: 2.3 — ABNORMAL HIGH (ref 1.2–2.2)
Albumin: 4.9 g/dL (ref 3.8–4.9)
Alkaline Phosphatase: 85 IU/L (ref 44–121)
BUN/Creatinine Ratio: 11 (ref 9–20)
BUN: 13 mg/dL (ref 6–24)
Bilirubin Total: 0.8 mg/dL (ref 0.0–1.2)
CO2: 24 mmol/L (ref 20–29)
Calcium: 9.6 mg/dL (ref 8.7–10.2)
Chloride: 100 mmol/L (ref 96–106)
Creatinine, Ser: 1.16 mg/dL (ref 0.76–1.27)
Globulin, Total: 2.1 g/dL (ref 1.5–4.5)
Glucose: 114 mg/dL — ABNORMAL HIGH (ref 70–99)
Potassium: 3.9 mmol/L (ref 3.5–5.2)
Sodium: 141 mmol/L (ref 134–144)
Total Protein: 7 g/dL (ref 6.0–8.5)
eGFR: 76 mL/min/{1.73_m2} (ref >59)

## 2021-02-23 LAB — HEMOGLOBIN A1C
Est. average glucose Bld gHb Est-mCnc: 97 mg/dL
Hgb A1c MFr Bld: 5 % (ref 4.8–5.6)

## 2021-02-23 LAB — LIPID PANEL
Chol/HDL Ratio: 5.1 ratio — ABNORMAL HIGH (ref 0.0–5.0)
Cholesterol, Total: 173 mg/dL (ref 100–199)
HDL: 34 mg/dL — ABNORMAL LOW (ref >39)
LDL Chol Calc (NIH): 87 mg/dL (ref 0–99)
Triglycerides: 317 mg/dL — ABNORMAL HIGH (ref 0–149)
VLDL Cholesterol Cal: 52 mg/dL — ABNORMAL HIGH (ref 5–40)

## 2021-02-23 LAB — HEPATITIS C ANTIBODY: Hep C Virus Ab: <0.1 s/co ratio (ref 0.0–0.9)

## 2021-03-15 LAB — COLOGUARD: COLOGUARD: NEGATIVE

## 2021-08-15 ENCOUNTER — Other Ambulatory Visit: Payer: Self-pay

## 2021-08-15 ENCOUNTER — Emergency Department: Payer: Managed Care, Other (non HMO)

## 2021-08-15 ENCOUNTER — Emergency Department
Admission: EM | Admit: 2021-08-15 | Discharge: 2021-08-15 | Disposition: A | Discharge status: Home / Self Care | Payer: Managed Care, Other (non HMO) | Attending: Emergency Medicine | Admitting: Emergency Medicine

## 2021-08-15 DIAGNOSIS — M25512 Pain in left shoulder: Secondary | ICD-10-CM | POA: Insufficient documentation

## 2021-08-15 DIAGNOSIS — R079 Chest pain, unspecified: Secondary | ICD-10-CM | POA: Diagnosis present

## 2021-08-15 DIAGNOSIS — M546 Pain in thoracic spine: Secondary | ICD-10-CM | POA: Insufficient documentation

## 2021-08-15 DIAGNOSIS — R0789 Other chest pain: Secondary | ICD-10-CM | POA: Diagnosis not present

## 2021-08-15 LAB — TROPONIN I (HIGH SENSITIVITY)
Troponin I (High Sensitivity): 3 ng/L (ref <18)
Troponin I (High Sensitivity): 4 ng/L (ref <18)

## 2021-08-15 LAB — CBC
HCT: 46.6 % (ref 39.0–52.0)
Hemoglobin: 16 g/dL (ref 13.0–17.0)
MCH: 30.2 pg (ref 26.0–34.0)
MCHC: 34.3 g/dL (ref 30.0–36.0)
MCV: 87.9 fL (ref 80.0–100.0)
Platelets: 211 10*3/uL (ref 150–400)
RBC: 5.3 MIL/uL (ref 4.22–5.81)
RDW: 12.4 % (ref 11.5–15.5)
WBC: 8.5 10*3/uL (ref 4.0–10.5)
nRBC: 0 % (ref 0.0–0.2)

## 2021-08-15 LAB — D-DIMER, QUANTITATIVE: D-Dimer, Quant: 0.3 ug/mL-FEU (ref 0.00–0.50)

## 2021-08-15 LAB — BASIC METABOLIC PANEL
Anion gap: 9 (ref 5–15)
BUN: 10 mg/dL (ref 6–20)
CO2: 26 mmol/L (ref 22–32)
Calcium: 9.2 mg/dL (ref 8.9–10.3)
Chloride: 102 mmol/L (ref 98–111)
Creatinine, Ser: 0.99 mg/dL (ref 0.61–1.24)
GFR, Estimated: >60 mL/min (ref >60)
Glucose, Bld: 112 mg/dL — ABNORMAL HIGH (ref 70–99)
Potassium: 3.6 mmol/L (ref 3.5–5.1)
Sodium: 137 mmol/L (ref 135–145)

## 2021-08-15 MED ORDER — ALUM & MAG HYDROXIDE-SIMETH 200-200-20 MG/5ML PO SUSP
30.0000 mL | Freq: Once | ORAL | Status: AC
  Administered 2021-08-15: 30 mL via ORAL
  Filled 2021-08-15: qty 30

## 2021-08-15 MED ORDER — LIDOCAINE VISCOUS HCL 2 % MT SOLN
15.0000 mL | Freq: Once | OROMUCOSAL | Status: AC
  Administered 2021-08-15: 15 mL via ORAL
  Filled 2021-08-15: qty 15

## 2021-08-15 NOTE — ED Provider Notes (Signed)
[facility] Provider Note    Event Date/Time   First MD Initiated Contact with Patient 08/15/21 1530     (approximate)   History   Chest Pain   HPI  Walter Baldwin is a 51 y.o. male  who, per PCP note dated 02/22/21 has history of elevated blood pressure readings without diagnosis of hypertension, though likely white coat syndrome, who presents to the emergency department today because of concern for chest pain.  Chest pain started last night.  Woke him up from bed.  Located in his left chest.  The time my exam he rates it a 2 out of 10.  However when he takes a deep breath that is worse.  Will become a 4 out of 10.  He denies any associated shortness of breath.  No nausea or vomiting.  In addition the patient has been having issues with left upper back and shoulder pain for roughly 1 year.  Has seen primary care and orthopedics for this.  Denies any recent travel.      Physical Exam   Triage Vital Signs: ED Triage Vitals  Enc Vitals Group     BP 08/15/21 1249 (!) 147/104     Pulse Rate 08/15/21 1249 68     Resp 08/15/21 1249 18     Temp 08/15/21 1249 98.7 F (37.1 C)     Temp Source 08/15/21 1249 Oral     SpO2 08/15/21 1249 100 %     Weight 08/15/21 1244 175 lb (79.4 kg)     Height 08/15/21 1244 5\' 9"  (1.753 m)     Head Circumference --      Peak Flow --      Pain Score 08/15/21 1244 4     Pain Loc --      Pain Edu? --      Excl. in GC? --     Most recent vital signs: Vitals:   08/15/21 1249 08/15/21 1522  BP: (!) 147/104 (!) 140/94  Pulse: 68 (!) 58  Resp: 18 18  Temp: 98.7 F (37.1 C)   SpO2: 100% 99%    General: Awake, alert, oriented. CV:  Good peripheral perfusion. Regular rate and rhythm. Resp:  Normal effort. Lungs clear to auscultation. Abd:  No distention. Non tender.  ED Results / Procedures / Treatments   Labs (all labs ordered are listed, but only abnormal results are displayed) Labs Reviewed  BASIC METABOLIC PANEL  - Abnormal; Notable for the following components:      Result Value   Glucose, Bld 112 (*)    All other components within normal limits  CBC  D-DIMER, QUANTITATIVE  TROPONIN I (HIGH SENSITIVITY)  TROPONIN I (HIGH SENSITIVITY)     EKG  I, 08/17/21, attending physician, personally viewed and interpreted this EKG  EKG Time: 1245 Rate: 68 Rhythm: normal sinus rhythm Axis: normal Intervals: qtc 401 QRS: narrow ST changes: no st elevation Impression: normal ekg  RADIOLOGY I independently interpreted and visualized the CXR. My interpretation: No pneumonia. No pneumothorax.  Radiology interpretation:  IMPRESSION:  No active cardiopulmonary disease.     PROCEDURES:  Critical Care performed: No  Procedures   MEDICATIONS ORDERED IN ED: Medications - No data to display   IMPRESSION / MDM / ASSESSMENT AND PLAN / ED COURSE  I reviewed the triage vital signs and the nursing notes.  Differential diagnosis includes, but is not limited to, PE, ACS, pneumothorax, pneumonia, chest wall pain.  Patient's presentation is most consistent with acute presentation with potential threat to life or bodily function.  Patient presented to the emergency department today because of concerns for left sided chest pain.  Pain is worse with deep inspiration.  Troponin was negative x2.  D-dimer was negative.  Chest x-ray without concerning pneumonia or pneumothorax.  Did try GI cocktail which did not provide any significant relief.  At this time I do think pleurisy or chest wall inflammation likely.  I discussed this with the patient.  Given reassuring work-up I do not feel patient necessitates inpatient admission at this time.  FINAL CLINICAL IMPRESSION(S) / ED DIAGNOSES   Final diagnoses:  Nonspecific chest pain  Chest wall pain   Note:  This document was prepared using Dragon voice recognition software and may include unintentional dictation errors.     Phineas Semen, MD 08/15/21 [phone]

## 2021-08-15 NOTE — ED Notes (Signed)
Pt stated he was ready to go eat and refused DC vitals. Papers reviewed with pt and pt gave verbal consent to DC

## 2021-08-15 NOTE — ED Triage Notes (Signed)
Pt  here with CP since last night. Pt states pain is left sided and radiates to his back and left shoulder. Pt states the pain is worst when he breathes deeply. Pt denies N/V/D.

## 2021-08-15 NOTE — Discharge Instructions (Signed)
Please seek medical attention for any high fevers, chest pain, shortness of breath, change in behavior, persistent vomiting, bloody stool or any other new or concerning symptoms.  

## 2021-08-16 ENCOUNTER — Other Ambulatory Visit: Payer: Self-pay | Admitting: Family Medicine

## 2021-08-16 ENCOUNTER — Ambulatory Visit: Payer: Managed Care, Other (non HMO) | Admitting: Family Medicine

## 2021-08-16 ENCOUNTER — Encounter: Payer: Self-pay | Admitting: Family Medicine

## 2021-08-16 VITALS — BP 130/94 | HR 67 | Temp 98.5°F | Resp 16 | Ht 69.0 in | Wt 171.6 lb

## 2021-08-16 DIAGNOSIS — R0789 Other chest pain: Secondary | ICD-10-CM | POA: Diagnosis not present

## 2021-08-16 DIAGNOSIS — I10 Essential (primary) hypertension: Secondary | ICD-10-CM | POA: Diagnosis not present

## 2021-08-16 DIAGNOSIS — I309 Acute pericarditis, unspecified: Secondary | ICD-10-CM | POA: Insufficient documentation

## 2021-08-16 DIAGNOSIS — R030 Elevated blood-pressure reading, without diagnosis of hypertension: Secondary | ICD-10-CM

## 2021-08-16 DIAGNOSIS — M65812 Other synovitis and tenosynovitis, left shoulder: Secondary | ICD-10-CM

## 2021-08-16 MED ORDER — NITROGLYCERIN 0.4 MG SL SUBL: 0.4000 mg | SUBLINGUAL_TABLET | SUBLINGUAL | 3 refills | Status: DC | PRN

## 2021-08-16 MED ORDER — COLCHICINE 0.6 MG PO TABS: 0.6000 mg | ORAL_TABLET | Freq: Two times a day (BID) | ORAL | 0 refills | Status: DC

## 2021-08-16 MED ORDER — LOSARTAN POTASSIUM-HCTZ 100-25 MG PO TABS: 1.0000 | ORAL_TABLET | Freq: Every day | ORAL | 3 refills | Status: DC

## 2021-08-16 MED ORDER — METHOCARBAMOL 750 MG PO TABS: 750.0000 mg | ORAL_TABLET | Freq: Every day | ORAL | 2 refills | Status: DC

## 2021-08-16 NOTE — Assessment & Plan Note (Signed)
Chronic, untreated Recommend start of dual agent Error on lower side of BP control 100-120s/60s recommended given complaints of HR awareness and atypical CP

## 2021-08-16 NOTE — Progress Notes (Signed)
I,Joseline E Rosas,acting as a scribe for Jacky Kindle, FNP.,have documented all relevant documentation on the behalf of Jacky Kindle, FNP,as directed by  Jacky Kindle, FNP while in the presence of Jacky Kindle, FNP.   Established patient visit   Patient: Walter Baldwin   DOB: 1971/01/18   51 y.o. Male  MRN: [id number] Visit Date: 08/16/2021  Today's healthcare provider: Jacky Kindle, FNP  Re Introduced to nurse practitioner role and practice setting.  All questions answered.  Discussed provider/patient relationship and expectations.   Chief Complaint  Patient presents with   ER follow-up   Subjective    HPI  Follow up ER visit  Patient was seen in ER for chest pain on 08/15/2021. He was treated for chest pain. Treatment for this included EKG, chest xray. He reports excellent compliance with treatment. He reports this condition is Unchanged. Reports that his chest was hurting last night and lasted until 3 am this morning.  -----------------------------------------------------------------------------------------   Medications: Outpatient Medications Prior to Visit  Medication Sig   aspirin EC 81 MG tablet Take 81 mg by mouth daily. Swallow whole.   omeprazole (PRILOSEC) 40 MG capsule Take 1 capsule (40 mg total) by mouth 2 (two) times daily.   [DISCONTINUED] diclofenac Sodium (VOLTAREN) 1 % GEL Apply 4 g topically 4 (four) times daily. (Patient not taking: Reported on 08/16/2021)   No facility-administered medications prior to visit.    Review of Systems     Objective    BP (!) 130/94 (BP Location: Left Arm, Patient Position: Sitting, Cuff Size: Large)   Pulse 67   Temp 98.5 F (36.9 C) (Oral)   Resp 16   Ht 5\' 9"  (1.753 m)   Wt 171 lb 9.6 oz (77.8 kg)   BMI 25.34 kg/m    Physical Exam Vitals and nursing note reviewed.  Constitutional:      Appearance: Normal appearance. He is normal weight.  HENT:     Head: Normocephalic and atraumatic.  Eyes:      Pupils: Pupils are equal, round, and reactive to light.  Cardiovascular:     Rate and Rhythm: Normal rate and regular rhythm.     Pulses: Normal pulses.     Heart sounds: Normal heart sounds.  Pulmonary:     Effort: Pulmonary effort is normal.     Breath sounds: Normal breath sounds.  Musculoskeletal:        General: Tenderness present. Normal range of motion.     Cervical back: Normal range of motion.  Skin:    General: Skin is warm and dry.     Capillary Refill: Capillary refill takes less than 2 seconds.  Neurological:     General: No focal deficit present.     Mental Status: He is alert and oriented to person, place, and time. Mental status is at baseline.      No results found for any visits on 08/16/21.  Assessment & Plan     Problem List Items Addressed This Visit       Cardiovascular and Mediastinum   Acute pericarditis    Linked to atypical CP; recommend trial of colchicine 0.6 mg BID for 30 days or until seen by cards  Recommend plan for TEE or ECHO Recommend dx cardiac cath or coronary CT scan       Relevant Medications   aspirin EC 81 MG tablet   losartan-hydrochlorothiazide (HYZAAR) 100-25 MG tablet   colchicine 0.6 MG tablet  nitroGLYCERIN (NITROSTAT) 0.4 MG SL tablet   Other Relevant Orders   Ambulatory referral to Cardiology   Primary hypertension   Relevant Medications   aspirin EC 81 MG tablet   losartan-hydrochlorothiazide (HYZAAR) 100-25 MG tablet   nitroGLYCERIN (NITROSTAT) 0.4 MG SL tablet     Musculoskeletal and Integument   Supraspinatus tenosynovitis    Chronic, remains Hold off on NSAIDs in setting of atypical CP and HTN Recommend use of muscle relaxanant qHS PRN        Other   Atypical chest pain - Primary    Acute, recurrent  Not relieved/worsening  Does not feel he has pleurisy Wishes to get SO at cards Report of heart awareness at radial and brachial arteries Worse with leaning forward Family hx of cardiac dz; in 50's         Relevant Medications   nitroGLYCERIN (NITROSTAT) 0.4 MG SL tablet   methocarbamol (ROBAXIN-750) 750 MG tablet   Elevated blood pressure reading without diagnosis of hypertension    Chronic, untreated Recommend start of dual agent Error on lower side of BP control 100-120s/60s recommended given complaints of HR awareness and atypical CP         Return in about 3 months (around 11/16/2021) for chonic disease management.      Leilani Merl, FNP, have reviewed all documentation for this visit. The documentation on 08/16/21 for the exam, diagnosis, procedures, and orders are all accurate and complete.    Jacky Kindle, FNP  [facility] [phone] (phone) [phone] (fax)  [facility]

## 2021-08-16 NOTE — Telephone Encounter (Signed)
Requested medication (s) are due for refill today: request 90 day supply   Requested medication (s) are on the active medication list: yes  Last refill:  08/16/21 #60 0 refills  Future visit scheduled: seen today , future visit in 6 months   Notes to clinic:  requesting 90 day supply      Requested Prescriptions  Pending Prescriptions Disp Refills   colchicine 0.6 MG tablet [Pharmacy Med Name: COLCHICINE 0.6MG  TABLETS] 180 tablet     Sig: TAKE 1 TABLET(0.6 MG) BY MOUTH TWICE DAILY     Endocrinology:  Gout Agents - colchicine Failed - 08/16/2021  3:36 PM      Failed - CBC within normal limits and completed in the last 12 months    WBC  Date Value Ref Range Status  08/15/2021 8.5 4.0 - 10.5 K/uL Final   RBC  Date Value Ref Range Status  08/15/2021 5.30 4.22 - 5.81 MIL/uL Final   Hemoglobin  Date Value Ref Range Status  08/15/2021 16.0 13.0 - 17.0 g/dL Final  76/28/3151 76.1 13.0 - 17.7 g/dL Final   HCT  Date Value Ref Range Status  08/15/2021 46.6 39.0 - 52.0 % Final   Hematocrit  Date Value Ref Range Status  12/04/2018 44.9 37.5 - 51.0 % Final   MCHC  Date Value Ref Range Status  08/15/2021 34.3 30.0 - 36.0 g/dL Final   [facility]  Date Value Ref Range Status  08/15/2021 30.2 26.0 - 34.0 pg Final   MCV  Date Value Ref Range Status  08/15/2021 87.9 80.0 - 100.0 fL Final  12/04/2018 88 79 - 97 fL Final  08/03/2013 89 80 - 100 fL Final   No results found for: "PLTCOUNTKUC", "LABPLAT", "POCPLA" RDW  Date Value Ref Range Status  08/15/2021 12.4 11.5 - 15.5 % Final  12/04/2018 12.5 11.6 - 15.4 % Final  08/03/2013 12.8 11.5 - 14.5 % Final         Passed - Cr in normal range and within 360 days    Creatinine  Date Value Ref Range Status  08/03/2013 1.07 0.60 - 1.30 mg/dL Final   Creatinine, Ser  Date Value Ref Range Status  08/15/2021 0.99 0.61 - 1.24 mg/dL Final         Passed - ALT in normal range and within 360 days    ALT  Date Value Ref Range Status   02/22/2021 28 0 - 44 IU/L Final   SGPT (ALT)  Date Value Ref Range Status  08/03/2013 36 12 - 78 U/L Final         Passed - AST in normal range and within 360 days    AST  Date Value Ref Range Status  02/22/2021 22 0 - 40 IU/L Final   SGOT(AST)  Date Value Ref Range Status  08/03/2013 18 15 - 37 Unit/L Final         Passed - Valid encounter within last 12 months    Recent Outpatient Visits           Today Atypical chest pain   [facility] Jacky Kindle, FNP   5 months ago Annual physical exam   [facility] Jacky Kindle, FNP   8 months ago Supraspinatus tenosynovitis, unspecified laterality   [facility] Jacky Kindle, FNP   2 years ago Pneumomediastinum [facility])   [facility] Trey Sailors, [place]   2 years ago Annual physical exam   [facility],  Lavella Hammock, PA-C       Future Appointments             Tomorrow Gollan, Tollie Pizza, MD [facility]   In 6 months Jacky Kindle, FNP [facility]

## 2021-08-16 NOTE — Assessment & Plan Note (Signed)
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

## 2021-08-16 NOTE — Assessment & Plan Note (Signed)
Acute, recurrent  Not relieved/worsening  Does not feel he has pleurisy Wishes to get SO at cards Report of heart awareness at radial and brachial arteries Worse with leaning forward Family hx of cardiac dz; in 39's

## 2021-08-16 NOTE — Assessment & Plan Note (Signed)
Chronic, remains Hold off on NSAIDs in setting of atypical CP and HTN Recommend use of muscle relaxanant qHS PRN

## 2021-08-17 ENCOUNTER — Encounter: Payer: Self-pay | Admitting: Cardiovascular Disease

## 2021-08-17 ENCOUNTER — Ambulatory Visit: Payer: Managed Care, Other (non HMO) | Admitting: Cardiovascular Disease

## 2021-08-17 VITALS — BP 130/80 | HR 62 | Ht 69.0 in | Wt 169.5 lb

## 2021-08-17 DIAGNOSIS — R079 Chest pain, unspecified: Secondary | ICD-10-CM | POA: Diagnosis not present

## 2021-08-17 NOTE — Progress Notes (Signed)
Cardiology Office Note  Date:  08/17/2021   ID:  Walter Baldwin, DOB Jul 25, 1970, MRN [id number]  PCP:  Jacky Kindle, FNP   Chief Complaint  Patient presents with   New Patient (Initial Visit)    Ref by Merita Norton, FNP for acute pericarditis. Patient c/o feeling some dizziness today. Medications reviewed by the patient verbally. Patient was at [facility] ER on 08/15/2021 with chest pain.     HPI:  Mr. Koah Chisenhall is a 51 year old gentleman with past medical history of HTN GERD Who presents by referral from Merita Norton for consultation of his acute pericarditis/chest pain  Tuesday night with chest pain 9 pm, took asa Hurt to take breath in Was told by nurse at work to go to the ER  Seen in the emergency room August 15, 2021 for chest pain Blood pressure elevated 147/104 Pain worse with deep inspiration, troponin negative x2, D-dimer negative, chest x-ray nonacute GI cocktail with no significant relief Was started on colchicine and blood pressure medication  That night with chest pain, wed night Started on asa again Seen by elise payne yesterday  EKG personally reviewed by myself on todays visit Normal sinus rhythm with rate 62 bpm no significant ST-T wave changes   PMH:   has a past medical history of Pneumomediastinum (HCC) (12/2018).  PSH:    Past Surgical History:  Procedure Laterality Date   ESOPHAGOGASTRODUODENOSCOPY (EGD) WITH PROPOFOL N/A 12/28/2018   Procedure: ESOPHAGOGASTRODUODENOSCOPY (EGD) WITH PROPOFOL;  Surgeon: Pasty Spillers, MD;  Location: [facility] ENDOSCOPY;  Service: Endoscopy;  Laterality: N/A;   ESOPHAGOGASTRODUODENOSCOPY (EGD) WITH PROPOFOL N/A 03/03/2019   Procedure: ESOPHAGOGASTRODUODENOSCOPY (EGD) WITH PROPOFOL;  Surgeon: Pasty Spillers, MD;  Location: [facility] ENDOSCOPY;  Service: Endoscopy;  Laterality: N/A;   NO PAST SURGERIES      Current Outpatient Medications  Medication Sig Dispense Refill   aspirin EC 81 MG tablet Take 81 mg by mouth  daily. Swallow whole.     colchicine 0.6 MG tablet Take 1 tablet (0.6 mg total) by mouth 2 (two) times daily. 60 tablet 0   losartan-hydrochlorothiazide (HYZAAR) 100-25 MG tablet Take 1 tablet by mouth daily. 90 tablet 3   methocarbamol (ROBAXIN-750) 750 MG tablet Take 1 tablet (750 mg total) by mouth at bedtime. 30 tablet 2   nitroGLYCERIN (NITROSTAT) 0.4 MG SL tablet Place 1 tablet (0.4 mg total) under the tongue every 5 (five) minutes as needed for chest pain. 50 tablet 3   omeprazole (PRILOSEC) 40 MG capsule Take 1 capsule (40 mg total) by mouth 2 (two) times daily. 120 capsule 0   No current facility-administered medications for this visit.    Allergies:   Patient has no known allergies.   Social History:  The patient  reports that he has never smoked. He has never used smokeless tobacco. He reports that he does not drink alcohol and does not use drugs.   Family History:   family history includes Diabetes in his brother; Heart attack in his mother; Lung cancer in his father.    Review of Systems: Review of Systems  Constitutional: Negative.   HENT: Negative.    Respiratory: Negative.    Cardiovascular: Negative.   Gastrointestinal: Negative.   Musculoskeletal: Negative.   Neurological: Negative.   Psychiatric/Behavioral: Negative.    All other systems reviewed and are negative.    PHYSICAL EXAM: VS:  BP 130/80 (BP Location: Left Arm, Patient Position: Sitting, Cuff Size: Normal)   Pulse 62   Ht 5'  9" (1.753 m)   Wt 169 lb 8 oz (76.9 kg)   SpO2 99%   BMI 25.03 kg/m  , BMI Body mass index is 25.03 kg/m. GEN: Well nourished, well developed, in no acute distress HEENT: normal Neck: no JVD, carotid bruits, or masses Cardiac: RRR; no murmurs, rubs, or gallops,no edema  Respiratory:  clear to auscultation bilaterally, normal work of breathing GI: soft, nontender, nondistended, + BS MS: no deformity or atrophy Skin: warm and dry, no rash Neuro:  Strength and sensation are  intact Psych: euthymic mood, full affect  Recent Labs: 02/22/2021: ALT 28 08/15/2021: BUN 10; Creatinine, Ser 0.99; Hemoglobin 16.0; Platelets 211; Potassium 3.6; Sodium 137    Lipid Panel Lab Results  Component Value Date   CHOL 173 02/22/2021   HDL 34 (L) 02/22/2021   LDLCALC 87 02/22/2021   TRIG 317 (H) 02/22/2021      Wt Readings from Last 3 Encounters:  08/17/21 169 lb 8 oz (76.9 kg)  08/16/21 171 lb 9.6 oz (77.8 kg)  08/15/21 175 lb (79.4 kg)       ASSESSMENT AND PLAN:  Problem List Items Addressed This Visit   None Visit Diagnoses     Chest pain of uncertain etiology    -  Primary   Relevant Orders   EKG 12-Lead      Atypical chest pain Possible pericarditis versus costochondritis Worse when supine, better sitting up He reports symptoms improved since starting colchicine He would like to avoid NSAIDs given history of GERD, currently on PPI Recommend he continue on colchicine twice a day for another 1 to 2 weeks then decrease down to once a day for 1 week then hold the colchicine For recurrent symptoms recommended he call our office, echocardiogram and possible reinitiation of colchicine could be performed  Right wrist pain Atypical in nature, likely neuropathic Good arterial flow noted Prior trauma to the area  Essential hypertension Started yesterday on losartan 100/HCTZ 25 Reports having little bit of dizziness Has not checked his pressure at home Recommend he closely monitor blood pressure and if it runs low, would consider cutting the pill in half Elevated blood pressure could be exacerbated by recent chest pain symptoms     Total encounter time more than 50 minutes  Greater than 50% was spent in counseling and coordination of care with the patient  Patient seen in consultation for Merita Norton and will be referred back to her office for ongoing care of the issues detailed above  Signed, Dossie Arbour, M.D., Ph.D. [facility]  [place], [place] [phone]

## 2021-08-17 NOTE — Patient Instructions (Addendum)
Medication Instructions:  Continue colchicine going twice a day for 1-2 weeks Then down to once a day for 1 week then stop  If you need a refill on your cardiac medications before your next appointment, please call your pharmacy.   Lab work: No new labs needed  Testing/Procedures: No new testing needed  Follow-Up: At [facility], you and your health needs are our priority.  As part of our continuing mission to provide you with exceptional heart care, we have created designated Provider Care Teams.  These Care Teams include your primary Cardiologist (physician) and Advanced Practice Providers (APPs -  Physician Assistants and Nurse Practitioners) who all work together to provide you with the care you need, when you need it.  You will need a follow up appointment as needed  Providers on your designated Care Team:   Nicolasa Ducking, NP Eula Listen, PA-C Cadence Fransico [place], [place]  COVID-19 Vaccine Information can be found at: [email or web address] For questions related to vaccine distribution or appointments, please email [email or web address] or call [phone].   For 1-2 weeks then down to one a day for 1 weeks then stop If

## 2022-02-26 ENCOUNTER — Encounter: Payer: Self-pay | Admitting: Family Medicine

## 2022-02-26 ENCOUNTER — Ambulatory Visit (INDEPENDENT_AMBULATORY_CARE_PROVIDER_SITE_OTHER): Payer: Managed Care, Other (non HMO) | Admitting: Family Medicine

## 2022-02-26 VITALS — BP 135/85 | HR 60 | Temp 98.3°F | Ht 69.0 in | Wt 171.7 lb

## 2022-02-26 DIAGNOSIS — Z125 Encounter for screening for malignant neoplasm of prostate: Secondary | ICD-10-CM | POA: Diagnosis not present

## 2022-02-26 DIAGNOSIS — Z0000 Encounter for general adult medical examination without abnormal findings: Secondary | ICD-10-CM | POA: Diagnosis not present

## 2022-02-26 NOTE — Assessment & Plan Note (Signed)
UTD on dental and vision Off BP meds at this time; BP remains borderline Recommend re-start if BP is elevated >140 and/or >90 Things to do to keep yourself healthy  - Exercise at least 30-45 minutes a day, 3-4 days a week.  - Eat a low-fat diet with lots of fruits and vegetables, up to 7-9 servings per day.  - Seatbelts can save your life. Wear them always.  - Smoke detectors on every level of your home, check batteries every year.  - Eye Doctor - have an eye exam every 1-2 years  - Safe sex - if you may be exposed to STDs, use a condom.  - Alcohol -  If you drink, do it moderately, less than 2 drinks per day.  - Health Care Power of Attorney. Choose someone to speak for you if you are not able.  - Depression is common in our stressful world.If you're feeling down or losing interest in things you normally enjoy, please come in for a visit.  - Violence - If anyone is threatening or hurting you, please call immediately.

## 2022-02-26 NOTE — Progress Notes (Signed)
Clovis Fredrickson Aijah Lattner,acting as a scribe for Walter Kindle, FNP.,have documented all relevant documentation on the behalf of Walter Kindle, FNP,as directed by  Walter Kindle, FNP while in the presence of Walter Kindle, FNP.   Complete physical exam   Patient: Walter Baldwin   DOB: 1970/11/23   51 y.o. Male  MRN: [id number] Visit Date: 02/26/2022  Today's healthcare provider: Jacky Kindle, FNP   Chief Complaint  Patient presents with   Annual Exam   Subjective    Walter Baldwin is a 51 y.o. male who presents today for a complete physical exam.  He reports consuming a general diet. Home exercise routine includes walking .5 hrs per days. He generally feels well. He reports sleeping well. He does not have additional problems to discuss today.   HPI   Past Medical History:  Diagnosis Date   Pneumomediastinum (HCC) 12/2018   after endo with dilation   Past Surgical History:  Procedure Laterality Date   ESOPHAGOGASTRODUODENOSCOPY (EGD) WITH PROPOFOL N/A 12/28/2018   Procedure: ESOPHAGOGASTRODUODENOSCOPY (EGD) WITH PROPOFOL;  Surgeon: Pasty Spillers, MD;  Location: [facility] ENDOSCOPY;  Service: Endoscopy;  Laterality: N/A;   ESOPHAGOGASTRODUODENOSCOPY (EGD) WITH PROPOFOL N/A 03/03/2019   Procedure: ESOPHAGOGASTRODUODENOSCOPY (EGD) WITH PROPOFOL;  Surgeon: Pasty Spillers, MD;  Location: [facility] END[facility]COPY;  Service: Endoscopy;  Laterality: N/A;   NO PAST SURGERIES     Social History   Socioeconomic History   Marital status: Married    Spouse name: Not on file   Number of children: Not on file   Years of education: Not on file   Highest education level: Not on file  Occupational History   Not on file  Tobacco Use   Smoking status: Never   Smokeless tobacco: Never  Vaping Use   Vaping Use: Never used  Substance and Sexual Activity   Alcohol use: Never   Drug use: Never   Sexual activity: Not on file  Other Topics Concern   Not on file  Social History Narrative   Not on  file   Social Determinants of Health   Financial Resource Strain: Not on file  Food Insecurity: Not on file  Transportation Needs: Not on file  Physical Activity: Not on file  Stress: Not on file  Social Connections: Not on file  Intimate Partner Violence: Not on file   Family Status  Relation Name Status   Mother  Deceased at age 8   Father  Deceased at age 91   Brother  Alive   Family History  Problem Relation Age of Onset   Heart attack Mother    Lung cancer Father    Diabetes Brother    No Known Allergies  Patient Care Team: Walter Kindle, FNP as PCP - General (Family Medicine)   Medications: Outpatient Medications Prior to Visit  Medication Sig   aspirin EC 81 MG tablet Take 81 mg by mouth daily. Swallow whole.   omeprazole (PRILOSEC) 40 MG capsule Take 1 capsule (40 mg total) by mouth 2 (two) times daily.   [DISCONTINUED] colchicine 0.6 MG tablet Take 1 tablet (0.6 mg total) by mouth 2 (two) times daily.   [DISCONTINUED] losartan-hydrochlorothiazide (HYZAAR) 100-25 MG tablet Take 1 tablet by mouth daily.   [DISCONTINUED] methocarbamol (ROBAXIN-750) 750 MG tablet Take 1 tablet (750 mg total) by mouth at bedtime.   [DISCONTINUED] nitroGLYCERIN (NITROSTAT) 0.4 MG SL tablet Place 1 tablet (0.4 mg total) under the tongue every 5 (five) minutes  as needed for chest pain.   No facility-administered medications prior to visit.    Review of Systems   Objective    BP 135/85 (BP Location: Right Arm, Patient Position: Sitting, Cuff Size: Normal)   Pulse 60   Temp 98.3 F (36.8 C) (Oral)   Ht 5\' 9"  (1.753 m)   Wt 171 lb 11.2 oz (77.9 kg)   SpO2 90%   BMI 25.36 kg/m   Physical Exam Vitals and nursing note reviewed.  Constitutional:      General: He is awake. He is not in acute distress.    Appearance: Normal appearance. He is well-developed, well-groomed and normal weight. He is not ill-appearing, toxic-appearing or diaphoretic.  HENT:     Head: Normocephalic and  atraumatic.     Jaw: There is normal jaw occlusion. No trismus, tenderness, swelling or pain on movement.     Salivary Glands: Right salivary gland is not diffusely enlarged or tender. Left salivary gland is not diffusely enlarged or tender.     Right Ear: Hearing, tympanic membrane, ear canal and external ear normal. There is no impacted cerumen.     Left Ear: Hearing, tympanic membrane, ear canal and external ear normal. There is no impacted cerumen.     Nose: Nose normal. No congestion or rhinorrhea.     Right Turbinates: Not enlarged, swollen or pale.     Left Turbinates: Not enlarged, swollen or pale.     Right Sinus: No maxillary sinus tenderness or frontal sinus tenderness.     Left Sinus: No maxillary sinus tenderness or frontal sinus tenderness.     Mouth/Throat:     Lips: Pink.     Mouth: Mucous membranes are moist. No injury, lacerations, oral lesions or angioedema.     Pharynx: Oropharynx is clear. Uvula midline. No pharyngeal swelling, oropharyngeal exudate or posterior oropharyngeal erythema.     Tonsils: No tonsillar exudate or tonsillar abscesses.  Eyes:     General: Lids are normal. Vision grossly intact. Gaze aligned appropriately.        Right eye: No discharge.        Left eye: No discharge.     Extraocular Movements: Extraocular movements intact.     Conjunctiva/sclera: Conjunctivae normal.     Pupils: Pupils are equal, round, and reactive to light.  Neck:     Thyroid: No thyroid mass, thyromegaly or thyroid tenderness.     Vascular: No carotid bruit.     Trachea: Trachea normal. No tracheal tenderness.  Cardiovascular:     Rate and Rhythm: Normal rate and regular rhythm.     Pulses: Normal pulses.          Carotid pulses are 2+ on the right side and 2+ on the left side.      Radial pulses are 2+ on the right side and 2+ on the left side.       Femoral pulses are 2+ on the right side and 2+ on the left side.      Popliteal pulses are 2+ on the right side and 2+ on  the left side.       Dorsalis pedis pulses are 2+ on the right side and 2+ on the left side.       Posterior tibial pulses are 2+ on the right side and 2+ on the left side.     Heart sounds: Normal heart sounds, S1 normal and S2 normal. No murmur heard.    No friction rub. No gallop.  Pulmonary:     Effort: Pulmonary effort is normal. No respiratory distress.     Breath sounds: Normal breath sounds and air entry. No stridor. No wheezing, rhonchi or rales.  Chest:     Chest wall: No tenderness.  Abdominal:     General: Abdomen is flat. Bowel sounds are normal. There is no distension.     Palpations: Abdomen is soft. There is no mass.     Tenderness: There is no abdominal tenderness. There is no guarding or rebound.     Hernia: No hernia is present.  Genitourinary:    Comments: Exam deferred; denies complaints Musculoskeletal:        General: No swelling, tenderness, deformity or signs of injury. Normal range of motion.     Cervical back: Normal range of motion and neck supple. No rigidity or tenderness.     Right lower leg: No edema.     Left lower leg: No edema.  Lymphadenopathy:     Cervical: No cervical adenopathy.     Right cervical: No superficial, deep or posterior cervical adenopathy.    Left cervical: No superficial, deep or posterior cervical adenopathy.  Skin:    General: Skin is warm and dry.     Capillary Refill: Capillary refill takes less than 2 seconds.     Coloration: Skin is not jaundiced or pale.     Findings: No bruising, erythema, lesion or rash.  Neurological:     General: No focal deficit present.     Mental Status: He is alert and oriented to person, place, and time. Mental status is at baseline.     GCS: GCS eye subscore is 4. GCS verbal subscore is 5. GCS motor subscore is 6.     Sensory: Sensation is intact. No sensory deficit.     Motor: Motor function is intact. No weakness.     Coordination: Coordination is intact.     Gait: Gait is intact.   Psychiatric:        Attention and Perception: Attention and perception normal.        Mood and Affect: Mood and affect normal.        Speech: Speech normal.        Behavior: Behavior normal. Behavior is cooperative.        Thought Content: Thought content normal.        Cognition and Memory: Cognition normal.        Judgment: Judgment normal.     Last depression screening scores    08/16/2021    2:32 PM 02/22/2021    2:36 PM 12/05/2020    2:39 PM  PHQ 2/9 Scores  PHQ - 2 Score 0 0 0  PHQ- 9 Score 4 1 2    Last fall risk screening    08/16/2021    2:32 PM  [place] in the past year? 0  Number falls in past yr: 0  Injury with Fall? 0   Last Audit-C alcohol use screening    08/16/2021    2:32 PM  Alcohol Use Disorder Test (AUDIT)  1. How often do you have a drink containing alcohol? 0  2. How many drinks containing alcohol do you have on a typical day when you are drinking? 0  3. How often do you have six or more drinks on one occasion? 0  AUDIT-C Score 0   A score of 3 or more in women, and 4 or more in men indicates increased risk for  alcohol abuse, EXCEPT if all of the points are from question 1   No results found for any visits on 02/26/22.  Assessment & Plan    Routine Health Maintenance and Physical Exam  Exercise Activities and Dietary recommendations  Goals   None     Immunization History  Administered Date(s) Administered   Influenza,inj,Quad PF,6+ Mos 12/04/2018   Tdap 12/04/2018    Health Maintenance  Topic Date Due   Zoster Vaccines- Shingrix (1 of 2) 05/28/2022 (Originally 12/13/2018)   INFLUENZA VACCINE  06/02/2022 (Originally 10/02/2021)   Fecal DNA (Cologuard)  03/08/2024   DTaP/Tdap/Td (2 - Td or Tdap) 12/03/2028   Hepatitis C Screening  Completed   HIV Screening  Completed   HPV VACCINES  Aged Out    Discussed health benefits of physical activity, and encouraged him to engage in regular exercise appropriate for his age and  condition.  Problem List Items Addressed This Visit       Other   Annual physical exam - Primary    UTD on dental and vision Off BP meds at this time; BP remains borderline Recommend re-start if BP is elevated >140 and/or >90 Things to do to keep yourself healthy  - Exercise at least 30-45 minutes a day, 3-4 days a week.  - Eat a low-fat diet with lots of fruits and vegetables, up to 7-9 servings per day.  - Seatbelts can save your life. Wear them always.  - Smoke detectors on every level of your home, check batteries every year.  - Eye Doctor - have an eye exam every 1-2 years  - Safe sex - if you may be exposed to STDs, use a condom.  - Alcohol -  If you drink, do it moderately, less than 2 drinks per day.  - [place]. Choose someone to speak for you if you are not able.  - Depression is common in our stressful world.If you're feeling down or losing interest in things you normally enjoy, please come in for a visit.  - Violence - If anyone is threatening or hurting you, please call immediately.       Relevant Orders   Comprehensive Metabolic Panel (CMET)   CBC   TSH   Lipid panel   Screening for prostate cancer    Denies LUTS; recommend PSA in place of DRE. If PSA is elevated for age, we will repeat; if PSA remains elevated pt will be referred to urology for DRE and next steps for best treatment.       Relevant Orders   PSA   Return in about 1 year (around 02/27/2023) for annual examination.    Vonna Kotyk, FNP, have reviewed all documentation for this visit. The documentation on 02/26/22 for the exam, diagnosis, procedures, and orders are all accurate and complete.  Gwyneth Sprout, [place] [phone] (phone) [phone] (fax)  [place]

## 2022-02-26 NOTE — Assessment & Plan Note (Signed)
Denies LUTS; recommend PSA in place of DRE. If PSA is elevated for age, we will repeat; if PSA remains elevated pt will be referred to urology for DRE and next steps for best treatment.  

## 2022-02-27 LAB — COMPREHENSIVE METABOLIC PANEL
ALT: 31 IU/L (ref 0–44)
AST: 27 IU/L (ref 0–40)
Albumin/Globulin Ratio: 2.3 — ABNORMAL HIGH (ref 1.2–2.2)
Albumin: 4.8 g/dL (ref 3.8–4.9)
Alkaline Phosphatase: 95 IU/L (ref 44–121)
BUN/Creatinine Ratio: 8 — ABNORMAL LOW (ref 9–20)
BUN: 10 mg/dL (ref 6–24)
Bilirubin Total: 0.9 mg/dL (ref 0.0–1.2)
CO2: 26 mmol/L (ref 20–29)
Calcium: 9.5 mg/dL (ref 8.7–10.2)
Chloride: 102 mmol/L (ref 96–106)
Creatinine, Ser: 1.21 mg/dL (ref 0.76–1.27)
Globulin, Total: 2.1 g/dL (ref 1.5–4.5)
Glucose: 85 mg/dL (ref 70–99)
Potassium: 4.4 mmol/L (ref 3.5–5.2)
Sodium: 142 mmol/L (ref 134–144)
Total Protein: 6.9 g/dL (ref 6.0–8.5)
eGFR: 72 mL/min/{1.73_m2} (ref >59)

## 2022-02-27 LAB — CBC
Hematocrit: 47.3 % (ref 37.5–51.0)
Hemoglobin: 16 g/dL (ref 13.0–17.7)
MCH: 30 pg (ref 26.6–33.0)
MCHC: 33.8 g/dL (ref 31.5–35.7)
MCV: 89 fL (ref 79–97)
Platelets: 215 10*3/uL (ref 150–450)
RBC: 5.33 x10E6/uL (ref 4.14–5.80)
RDW: 12.3 % (ref 11.6–15.4)
WBC: 7.8 10*3/uL (ref 3.4–10.8)

## 2022-02-27 LAB — LIPID PANEL
Chol/HDL Ratio: 4.4 ratio (ref 0.0–5.0)
Cholesterol, Total: 164 mg/dL (ref 100–199)
HDL: 37 mg/dL — ABNORMAL LOW (ref >39)
LDL Chol Calc (NIH): 87 mg/dL (ref 0–99)
Triglycerides: 238 mg/dL — ABNORMAL HIGH (ref 0–149)
VLDL Cholesterol Cal: 40 mg/dL (ref 5–40)

## 2022-02-27 LAB — PSA: Prostate Specific Ag, Serum: 1.4 ng/mL (ref 0.0–4.0)

## 2022-02-27 LAB — TSH: TSH: 2.07 u[IU]/mL (ref 0.450–4.500)

## 2022-02-27 NOTE — Progress Notes (Signed)
Fats/triglycerides remain elevated on cholesterol panel. Continue to recommend balanced, lower carb meals, including heart healthy fats. Smaller meal size, adding snacks. Choosing water as drink of choice and increasing purposeful exercise.  All other labs are normal and stable.  Please let us know if you have any questions.  Thank you, Jacky Kindle, FNP  [facility] [address] [place], [place] [address] [phone] (phone) [phone] (fax) [facility]

## 2023-02-28 ENCOUNTER — Encounter: Payer: Managed Care, Other (non HMO) | Admitting: Family Medicine

## 2024-02-16 NOTE — Progress Notes (Unsigned)
° ° ° °  Complete physical exam   Patient: Walter Baldwin   DOB: 09-14-1970   53 y.o. Male  MRN: [id number] Visit Date: 02/17/2024  Today's healthcare provider: Isaiah DELENA Pepper, MD   No chief complaint on file.  Subjective    Walter Baldwin is a 53 y.o. male who presents today for a complete physical exam.  He reports consuming a {diet types:17450} diet. {Exercise:19826} He generally feels {well/fairly well/poorly:18703}. He reports sleeping {well/fairly well/poorly:18703}. He {does/does not:200015} have additional problems to discuss today.    Discussed the use of AI scribe software for clinical note transcription with the patient, who gave verbal consent to proceed.  History of Present Illness     Last depression screening scores    08/16/2021    2:32 PM 02/22/2021    2:36 PM 12/05/2020    2:39 PM  PHQ 2/9 Scores  PHQ - 2 Score 0 0 0  PHQ- 9 Score 4  1  2       Data saved with a previous flowsheet row definition   Last fall risk screening    08/16/2021    2:32 PM  Fall Risk   Falls in the past year? 0  Number falls in past yr: 0  Injury with Fall? 0      Data saved with a previous flowsheet row definition    {VISON DENTAL STD PSA (Optional):27386}  {History (Optional):23778}  Medications: Show/hide medication list[1]  Review of Systems as noted in HPI.  {Insert previous labs (optional):23779} {See past labs  Heme  Chem  Endocrine  Serology  Results Review (optional):1}  Objective    There were no vitals taken for this visit. {Insert last BP/Wt (optional):23777}{See vitals history (optional):1}  Physical Exam   No results found for any visits on 02/17/24.  Assessment & Plan    Routine Health Maintenance and Physical Exam  Exercise Activities and Dietary recommendations  Goals   None     Immunization History  Administered Date(s) Administered   Influenza,inj,Quad PF,6+ Mos 12/04/2018   Tdap 12/04/2018    Health Maintenance  Topic Date  Due   Hepatitis B Vaccines 19-59 Average Risk (1 of 3 - 19+ 3-dose series) Never done   Pneumococcal Vaccine: 50+ Years (1 of 1 - PCV) Never done   Zoster Vaccines- Shingrix  (1 of 2) Never done   Influenza Vaccine  10/03/2023   COVID-19 Vaccine (1 - 2025-26 season) Never done   Fecal DNA (Cologuard)  03/08/2024   DTaP/Tdap/Td (2 - Td or Tdap) 12/03/2028   Hepatitis C Screening  Completed   HIV Screening  Completed   HPV VACCINES  Aged Out   Meningococcal B Vaccine  Aged Out    Discussed health benefits of physical activity, and encouraged him to engage in regular exercise appropriate for his age and condition.  Problem List Items Addressed This Visit   None   Assessment and Plan Assessment & Plan       No follow-ups on file.     Isaiah DELENA Pepper, MD  [facility] [phone] (phone) [phone] (fax)    [1]  Outpatient Medications Prior to Visit  Medication Sig   aspirin EC 81 MG tablet Take 81 mg by mouth daily. Swallow whole.   omeprazole  (PRILOSEC) 40 MG capsule Take 1 capsule (40 mg total) by mouth 2 (two) times daily.   No facility-administered medications prior to visit.

## 2024-02-17 ENCOUNTER — Encounter: Admitting: Family Medicine

## 2024-02-17 ENCOUNTER — Ambulatory Visit

## 2024-02-17 VITALS — BP 143/84 | HR 72 | Resp 16 | Ht 69.0 in | Wt 172.0 lb

## 2024-02-17 DIAGNOSIS — Z23 Encounter for immunization: Secondary | ICD-10-CM | POA: Diagnosis not present

## 2024-02-17 DIAGNOSIS — I10 Essential (primary) hypertension: Secondary | ICD-10-CM

## 2024-02-17 DIAGNOSIS — Z0000 Encounter for general adult medical examination without abnormal findings: Secondary | ICD-10-CM

## 2024-02-17 DIAGNOSIS — Z0001 Encounter for general adult medical examination with abnormal findings: Secondary | ICD-10-CM | POA: Diagnosis not present

## 2024-02-17 DIAGNOSIS — Z131 Encounter for screening for diabetes mellitus: Secondary | ICD-10-CM

## 2024-02-17 DIAGNOSIS — Z136 Encounter for screening for cardiovascular disorders: Secondary | ICD-10-CM

## 2024-02-17 MED ORDER — HYDROCHLOROTHIAZIDE 12.5 MG PO TABS: 12.5000 mg | ORAL_TABLET | Freq: Every day | ORAL | 3 refills | Status: DC

## 2024-02-18 LAB — COMPREHENSIVE METABOLIC PANEL WITH GFR
ALT: 34 IU/L (ref 0–44)
AST: 23 IU/L (ref 0–40)
Albumin: 4.8 g/dL (ref 3.8–4.9)
Alkaline Phosphatase: 96 IU/L (ref 47–123)
BUN/Creatinine Ratio: 7 — ABNORMAL LOW (ref 9–20)
BUN: 8 mg/dL (ref 6–24)
Bilirubin Total: 1 mg/dL (ref 0.0–1.2)
CO2: 24 mmol/L (ref 20–29)
Calcium: 9.8 mg/dL (ref 8.7–10.2)
Chloride: 100 mmol/L (ref 96–106)
Creatinine, Ser: 1.15 mg/dL (ref 0.76–1.27)
Globulin, Total: 2.6 g/dL (ref 1.5–4.5)
Glucose: 82 mg/dL (ref 70–99)
Potassium: 4 mmol/L (ref 3.5–5.2)
Sodium: 141 mmol/L (ref 134–144)
Total Protein: 7.4 g/dL (ref 6.0–8.5)
eGFR: 75 mL/min/1.73 (ref >59)

## 2024-02-18 LAB — LIPID PANEL
Chol/HDL Ratio: 4.7 ratio (ref 0.0–5.0)
Cholesterol, Total: 180 mg/dL (ref 100–199)
HDL: 38 mg/dL — ABNORMAL LOW (ref >39)
LDL Chol Calc (NIH): 102 mg/dL — ABNORMAL HIGH (ref 0–99)
Triglycerides: 235 mg/dL — ABNORMAL HIGH (ref 0–149)
VLDL Cholesterol Cal: 40 mg/dL (ref 5–40)

## 2024-02-18 LAB — HEMOGLOBIN A1C
Est. average glucose Bld gHb Est-mCnc: 94 mg/dL
Hgb A1c MFr Bld: 4.9 % (ref 4.8–5.6)

## 2024-03-19 ENCOUNTER — Ambulatory Visit (INDEPENDENT_AMBULATORY_CARE_PROVIDER_SITE_OTHER)

## 2024-03-19 VITALS — BP 139/89 | HR 69 | Temp 98.2°F | Wt 172.3 lb

## 2024-03-19 DIAGNOSIS — I10 Essential (primary) hypertension: Secondary | ICD-10-CM | POA: Diagnosis not present

## 2024-03-19 DIAGNOSIS — Z1211 Encounter for screening for malignant neoplasm of colon: Secondary | ICD-10-CM

## 2024-03-19 MED ORDER — HYDROCHLOROTHIAZIDE 12.5 MG PO TABS: 12.5000 mg | ORAL_TABLET | Freq: Every day | ORAL | 3 refills | Status: AC

## 2024-03-19 NOTE — Progress Notes (Signed)
 "     Established patient visit   Patient: Walter Baldwin   DOB: [birth year]   54 y.o. Male  MRN: [id number] Visit Date: 03/19/2024  Today's healthcare provider: Isaiah DELENA Pepper, MD   Chief Complaint  Patient presents with   Follow-up    Blood pressure    Subjective    HPI  Discussed the use of AI scribe software for clinical note transcription with the patient, who gave verbal consent to proceed.  History of Present Illness Walter Baldwin is a 54 year old male with hypertension who presents for a follow-up visit regarding blood pressure management.  His home blood pressure readings have been around 135/80-85 mmHg. He is on a low dose of medication and has not experienced any side effects. He has started drinking a Gatorade daily to replenish electrolytes, substituting it for one glass of water.  He discusses the need for colon cancer screening. He completed a Cologuard test three years ago and is due for another screening. He has a family history of colon cancer, as his brother has been diagnosed with it. He has not had a colonoscopy and is reluctant to schedule procedures unless necessary due to a previous negative experience with an endoscopy.  Recent blood work included tests for diabetes, electrolytes, kidney, and liver functions. However, his cholesterol was slightly elevated with an LDL of 102 mg/dL, though he was not fasting at the time of the test.  Medications: Show/hide medication list[1]  Review of Systems as noted in HPI.      Objective    BP 139/89   Pulse 69   Temp 98.2 F (36.8 C) (Oral)   Wt 172 lb 4.8 oz (78.2 kg)   SpO2 99%   BMI 25.44 kg/m    Physical Exam Constitutional:      Appearance: Normal appearance.  HENT:     Head: Normocephalic and atraumatic.     Mouth/Throat:     Mouth: Mucous membranes are moist.  Eyes:     Pupils: Pupils are equal, round, and reactive to light.  Pulmonary:     Effort: Pulmonary effort is normal.  Skin:     General: Skin is warm.  Neurological:     General: No focal deficit present.     Mental Status: He is alert.      No results found for any visits on 03/19/24.  Assessment & Plan     Problem List Items Addressed This Visit       Cardiovascular and Mediastinum   Primary hypertension   Relevant Medications   hydrochlorothiazide  (HYDRODIURIL ) 12.5 MG tablet   Other Relevant Orders   Basic metabolic panel with GFR   Other Visit Diagnoses       Screening for colon cancer    -  Primary   Relevant Orders   Cologuard      Assessment & Plan Primary hypertension Chronic. Blood pressure controlled since starting hydrochlorothiazide  12.5mg . No medication side effects. - Continue current antihypertensive medication. - Rechecked electrolytes today. - Monitor blood pressure at home and report if consistently above 140/90 mmHg.  Screening for colon cancer Due for screening. Family history in brother. Prefers Cologuard due to past endoscopy complications. - Ordered Cologuard test.  Hyperlipidemia LDL cholesterol at 102 mg/dL, slightly elevated. Non-fasting during last test. - Advised dietary modifications to reduce red meat, fried foods, and dairy products. - Recheck cholesterol levels next year, preferably fasting.   Return in about 1 year (around 03/19/2025) for Annual  Physical Exam.       Isaiah DELENA Pepper, MD  [facility] [phone] (phone) [phone] (fax)     [1]  Outpatient Medications Prior to Visit  Medication Sig   [DISCONTINUED] hydrochlorothiazide  (HYDRODIURIL ) 12.5 MG tablet Take 1 tablet (12.5 mg total) by mouth daily.   No facility-administered medications prior to visit.   "

## 2024-03-20 ENCOUNTER — Ambulatory Visit: Payer: Self-pay

## 2024-03-20 LAB — BASIC METABOLIC PANEL WITH GFR
BUN/Creatinine Ratio: 6 — ABNORMAL LOW (ref 9–20)
BUN: 8 mg/dL (ref 6–24)
CO2: 26 mmol/L (ref 20–29)
Calcium: 9.8 mg/dL (ref 8.7–10.2)
Chloride: 100 mmol/L (ref 96–106)
Creatinine, Ser: 1.26 mg/dL (ref 0.76–1.27)
Glucose: 82 mg/dL (ref 70–99)
Potassium: 4.1 mmol/L (ref 3.5–5.2)
Sodium: 141 mmol/L (ref 134–144)
eGFR: 67 mL/min/1.73

## 2024-04-06 LAB — COLOGUARD: COLOGUARD: NEGATIVE

## 2024-07-13 ENCOUNTER — Ambulatory Visit

## 2024-08-13 ENCOUNTER — Ambulatory Visit

## 2025-03-22 ENCOUNTER — Encounter
